# Patient Record
Sex: Female | Born: 1985 | Race: Black or African American | Hispanic: No | Marital: Single | Smoking: Current every day smoker
Health system: Southern US, Community
[De-identification: ages and names within clinical notes are randomized; demographics above are authoritative.]

## PROBLEM LIST (undated history)

## (undated) DIAGNOSIS — Z789 Other specified health status: Secondary | ICD-10-CM

## (undated) DIAGNOSIS — O139 Gestational [pregnancy-induced] hypertension without significant proteinuria, unspecified trimester: Secondary | ICD-10-CM

## (undated) DIAGNOSIS — D649 Anemia, unspecified: Secondary | ICD-10-CM

## (undated) HISTORY — DX: Gestational (pregnancy-induced) hypertension without significant proteinuria, unspecified trimester: O13.9

## (undated) HISTORY — PX: NO PAST SURGERIES: SHX2092

---

## 2016-09-30 ENCOUNTER — Emergency Department (HOSPITAL_COMMUNITY): Admission: EM | Admit: 2016-09-30 | Discharge: 2016-10-01 | Discharge status: Against Medical Advice | Payer: Self-pay

## 2016-09-30 NOTE — ED Notes (Signed)
Patient called with no answer. °

## 2016-09-30 NOTE — ED Notes (Signed)
Patient called x 3 with no answer.  All areas of the waiting room checked.

## 2017-04-15 ENCOUNTER — Emergency Department (HOSPITAL_COMMUNITY): Payer: Self-pay

## 2017-04-15 ENCOUNTER — Encounter (HOSPITAL_COMMUNITY): Payer: Self-pay | Admitting: *Deleted

## 2017-04-15 ENCOUNTER — Emergency Department (HOSPITAL_COMMUNITY)
Admission: EM | Admit: 2017-04-15 | Discharge: 2017-04-15 | Disposition: A | Discharge status: Home / Self Care | Payer: Self-pay | Attending: Emergency Medicine | Admitting: Emergency Medicine

## 2017-04-15 DIAGNOSIS — M25561 Pain in right knee: Secondary | ICD-10-CM | POA: Insufficient documentation

## 2017-04-15 DIAGNOSIS — L2082 Flexural eczema: Secondary | ICD-10-CM | POA: Insufficient documentation

## 2017-04-15 DIAGNOSIS — M545 Low back pain, unspecified: Secondary | ICD-10-CM

## 2017-04-15 MED ORDER — HYDROCORTISONE 1 % EX CREA: TOPICAL_CREAM | CUTANEOUS | 0 refills | Status: DC

## 2017-04-15 NOTE — Discharge Instructions (Signed)
Please read attached information. If you experience any new or worsening signs or symptoms please return to the emergency room for evaluation. Please follow-up with your primary care provider or specialist as discussed. Please use medication prescribed only as directed and discontinue taking if you have any concerning signs or symptoms.   °

## 2017-04-15 NOTE — ED Provider Notes (Signed)
[facility]-EMERGENCY DEPT Provider Note   CSN: [id number] Arrival date & time: 04/15/17  0946     History   Chief Complaint Chief Complaint  Patient presents with  . Back Pain  . Knee Pain    HPI Christine Mullen is a 31 y.o. female.  HPI   31 year old female presents today with numerous complaints.  Patient reports for approximately 1 week she has had left lower lumbar back pain.  She denies any radiation of symptoms, denies any abdominal pain, distal neurological deficits, or any other red flags.  Patient denies any specific injury, reports she works in a [employer] lifting heavy boxes.  She notes movements of the torso worsen her symptoms.  Patient notes ibuprofen helps with the back pain.  Patient also notes she is having right knee pain, described as diffuse achy pain with no radiation of symptoms.  Full active range of motion of the knee, no redness, swelling or warmth to touch.  She denies any infectious etiologies.  Patient denies any trauma to the knee in the past, again ibuprofen improves her symptoms.  Patient also reports dryness to the left flexor surface of her left elbow.  Denies any history of the same, no medications prior to arrival.  No other rashes noted.   History reviewed. No pertinent past medical history.  There are no active problems to display for this patient.   History reviewed. No pertinent surgical history.  OB History    No data available       Home Medications    Prior to Admission medications   Medication Sig Start Date End Date Taking? Authorizing Provider  hydrocortisone cream 1 % Apply to affected area 2 times daily 04/15/17   Shalin Linders, Tinnie GensJeffrey, PA-C    Family History No family history on file.  Social History Social History  Substance Use Topics  . Smoking status: Current Every Day Smoker  . Smokeless tobacco: Never Used  . Alcohol use Yes     Comment: daily     Allergies   Patient has no known allergies.   Review of  Systems Review of Systems  All other systems reviewed and are negative.    Physical Exam Updated Vital Signs BP (!) 141/100 (BP Location: Right Arm)   Pulse 72   Temp 98.2 F (36.8 C) (Oral)   Resp 18   LMP 03/24/2017 (Exact Date)   SpO2 99%   Physical Exam  Constitutional: She is oriented to person, place, and time. She appears well-developed and well-nourished.  HENT:  Head: Normocephalic and atraumatic.  Eyes: Pupils are equal, round, and reactive to light. Conjunctivae are normal. Right eye exhibits no discharge. Left eye exhibits no discharge. No scleral icterus.  Neck: Normal range of motion. No JVD present. No tracheal deviation present.  Pulmonary/Chest: Effort normal. No stridor.  Musculoskeletal:  No CT or L-spine tenderness palpation.  Tenderness palpation left lower lumbar soft tissue.  Straight leg negative, distal sensation strength and motor function intact.  Right knee atraumatic, no warmth to touch, no redness, full active range of motion  Neurological: She is alert and oriented to person, place, and time. Coordination normal.  Skin:  Eczema left antecubital fossa  Psychiatric: She has a normal mood and affect. Her behavior is normal. Judgment and thought content normal.  Nursing note and vitals reviewed.    ED Treatments / Results  Labs (all labs ordered are listed, but only abnormal results are displayed) Labs Reviewed - No data to display  EKG  EKG Interpretation None       Radiology Dg Knee Complete 4 Views Right  Result Date: 04/15/2017 CLINICAL DATA:  Right knee pain.  No injury. EXAM: RIGHT KNEE - COMPLETE 4+ VIEW COMPARISON:  None. FINDINGS: No evidence of fracture, dislocation, or joint effusion. No evidence of arthropathy or other focal bone abnormality. Soft tissues are unremarkable. IMPRESSION: Negative. Electronically Signed   By: Charlett Nose M.D.   On: 04/15/2017 10:39    Procedures Procedures (including critical care  time)  Medications Ordered in ED Medications - No data to display   Initial Impression / Assessment and Plan / ED Course  I have reviewed the triage vital signs and the nursing notes.  Pertinent labs & imaging results that were available during my care of the patient were reviewed by me and considered in my medical decision making (see chart for details).       Final Clinical Impressions(s) / ED Diagnoses   Final diagnoses:  Acute left-sided low back pain without sciatica  Flexural eczema  Acute pain of right knee   31 year old female presents today with numerous complaints.  Patient with likely musculoskeletal back pain.  No signs of trauma, no red flags, no need for imaging here.,  Patient also reporting right knee pain, likely overuse, no signs of infectious etiology here today.  No need for imaging at this time.  Patient will be treated with ibuprofen, rest, back strengthening exercises.  Patient also with what appears to be eczema, she will be started on hydrocortisone cream, she is given outpatient follow-up information, strict return precautions.  She verbalized understanding and agreement to today's plan had no further questions or concerns the time discharge.   New Prescriptions New Prescriptions   HYDROCORTISONE CREAM 1 %    Apply to affected area 2 times daily     Eyvonne Mechanic, PA-C 04/15/17 1219    Pricilla Loveless, MD 04/18/17 1131

## 2017-04-15 NOTE — ED Notes (Signed)
Pt seen and discharged prior to RN assessment

## 2017-04-15 NOTE — ED Triage Notes (Signed)
Pt is here with left lower back pain with movement for one week and right knee pain for a while.  No urinary or vaginal symptoms

## 2017-05-26 ENCOUNTER — Emergency Department (HOSPITAL_COMMUNITY)
Admission: EM | Admit: 2017-05-26 | Discharge: 2017-05-26 | Disposition: A | Discharge status: Home / Self Care | Payer: Self-pay | Attending: Physician Assistant | Admitting: Physician Assistant

## 2017-05-26 ENCOUNTER — Encounter (HOSPITAL_COMMUNITY): Payer: Self-pay

## 2017-05-26 DIAGNOSIS — Z79899 Other long term (current) drug therapy: Secondary | ICD-10-CM | POA: Insufficient documentation

## 2017-05-26 DIAGNOSIS — M545 Low back pain, unspecified: Secondary | ICD-10-CM

## 2017-05-26 DIAGNOSIS — F17200 Nicotine dependence, unspecified, uncomplicated: Secondary | ICD-10-CM | POA: Insufficient documentation

## 2017-05-26 MED ORDER — IBUPROFEN 800 MG PO TABS: 800.0000 mg | ORAL_TABLET | Freq: Four times a day (QID) | ORAL | 0 refills | Status: DC | PRN

## 2017-05-26 MED ORDER — CYCLOBENZAPRINE HCL 10 MG PO TABS: 10.0000 mg | ORAL_TABLET | Freq: Two times a day (BID) | ORAL | 0 refills | Status: DC | PRN

## 2017-05-26 NOTE — ED Triage Notes (Signed)
Pt states left lower back pain X1 week. 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 Pt ambulatory. Denies any numbness or tingling or loss of bowel or bladder control.

## 2017-05-26 NOTE — ED Provider Notes (Signed)
[facility]   CSN: [id number] Arrival date & time: 05/26/17  1019     History   Chief Complaint Chief Complaint  Patient presents with  . Back Pain    HPI Christine Bonieriffany Mullen is a 31 y.o. female.  HPI  Christine Mullen is a 31yo female with no significant past medical history who presents to the emergency department with 1 week of lower left back pain. She states that she had similar symptoms several weeks ago, with improvement from ibuprofen and rest. Beginning last week she again experienced left lower back pain which is 8/10 in severity, "sharp" in nature and constant. It is worsened with leaning forward. She states that ibuprofen helps her symptoms. Denies recent injury but says that she works at a [employer] and lifts heavy boxes at work. She denies fever, urinary retention/incontinence, bowel incontinence, perineal anesthesia, numbness, weakness, abdominal pain, vaginal discharge, pelvic pain. Denies IV drug use. No history of cancer in the past. No previous back surgeries.  History reviewed. No pertinent past medical history.  There are no active problems to display for this patient.   History reviewed. No pertinent surgical history.  OB History    No data available       Home Medications    Prior to Admission medications   Medication Sig Start Date End Date Taking? Authorizing Provider  cyclobenzaprine (FLEXERIL) 10 MG tablet Take 1 tablet (10 mg total) by mouth 2 (two) times daily as needed for muscle spasms. 05/26/17   Kellie ShropshireShrosbree, Yesica Kemler J, PA-C  hydrocortisone cream 1 % Apply to affected area 2 times daily 04/15/17   Hedges, Tinnie GensJeffrey, PA-C  ibuprofen (ADVIL,MOTRIN) 800 MG tablet Take 1 tablet (800 mg total) by mouth every 6 (six) hours as needed. 05/26/17   Kellie ShropshireShrosbree, Nicco Reaume J, PA-C    Family History History reviewed. No pertinent family history.  Social History Social History  Substance Use Topics  . Smoking  status: Current Every Day Smoker  . Smokeless tobacco: Never Used  . Alcohol use Yes     Comment: daily     Allergies   Patient has no known allergies.   Review of Systems Review of Systems  Constitutional: Negative for chills, fatigue, fever and unexpected weight change.  Gastrointestinal: Negative for abdominal pain, diarrhea, nausea and vomiting.  Genitourinary: Negative for difficulty urinating, dysuria, flank pain, hematuria, pelvic pain and vaginal discharge.  Musculoskeletal: Positive for back pain. Negative for arthralgias, gait problem, myalgias, neck pain and neck stiffness.  Skin: Negative for rash and wound.  Neurological: Negative for weakness, numbness and headaches.     Physical Exam Updated Vital Signs BP (!) 151/94 (BP Location: Left Arm)   Pulse 62   Temp 97.9 F (36.6 C) (Oral)   Resp 16   LMP 05/20/2017 (Within Days)   SpO2 100%   Physical Exam  Constitutional: She is oriented to person, place, and time. She appears well-developed and well-nourished. No distress.  HENT:  Head: Normocephalic and atraumatic.  Eyes: Right eye exhibits no discharge. Left eye exhibits no discharge.  Neck: Normal range of motion. Neck supple.  No tenderness over the cervical spinous processes.  Pulmonary/Chest: Effort normal. No respiratory distress.  Abdominal: Soft. Bowel sounds are normal. There is no tenderness. There is no guarding.  No pulsatile mass. No CVA tenderness.  Musculoskeletal:  Tenderness to palpation over the left lumbar paraspinal muscles. No tenderness to palpation over the spinous processes of the thoracic or lumbar spine.  5/5 strength in bilateral knees, ankle joints. DP and PT pulses 2+ bilaterally.  Neurological: She is alert and oriented to person, place, and time. Coordination normal.  Patellar reflex 2+ bilaterally. Distal sensation to soft touch intact in bilateral lower extremities. Gait normal.   Skin: Skin is warm and dry. Capillary refill  takes less than 2 seconds. She is not diaphoretic.  Psychiatric: She has a normal mood and affect. Her behavior is normal.  Nursing note and vitals reviewed.    ED Treatments / Results  Labs (all labs ordered are listed, but only abnormal results are displayed) Labs Reviewed - No data to display  EKG  EKG Interpretation None       Radiology No results found.  Procedures Procedures (including critical care time)  Medications Ordered in ED Medications - No data to display   Initial Impression / Assessment and Plan / ED Course  I have reviewed the triage vital signs and the nursing notes.  Pertinent labs & imaging results that were available during my care of the patient were reviewed by me and considered in my medical decision making (see chart for details).     Patient with back pain. No neurological deficits and normal neuro exam.  Patient can walk independently without pain. No loss of bowel or bladder control. No concern for cauda equina.  No fever, night sweats, weight loss, h/o cancer, IVDU. RICE protocol and pain medicine indicated and discussed with patient. Have discussed return precautions including loss of continence, fever with back pain, numbness or weakness in the legs with pain. Patient's blood pressure elevated while in the ED, have counseled her to have this rechecked at primary care office. Patient agrees and voices understanding with the above plan.  Final Clinical Impressions(s) / ED Diagnoses   Final diagnoses:  Acute left-sided low back pain without sciatica    New Prescriptions New Prescriptions   CYCLOBENZAPRINE (FLEXERIL) 10 MG TABLET    Take 1 tablet (10 mg total) by mouth 2 (two) times daily as needed for muscle spasms.   IBUPROFEN (ADVIL,MOTRIN) 800 MG TABLET    Take 1 tablet (800 mg total) by mouth every 6 (six) hours as needed.     Kellie Shropshire, PA-C 05/26/17 1245    Abelino Derrick, MD 05/26/17 1600

## 2017-05-26 NOTE — Discharge Instructions (Signed)
Please take 800 mg of ibuprofen every 6 hours as needed for pain. I have also written a prescription for a muscle relaxer called Flexeril. This medicine can make you drowsy, please do not drive or drink alcohol while taking this medication. Applying heat to the lower back will also help with your symptoms.  I have written him a note for work.  Your blood pressure was elevated in the ER today. Please have this rechecked your primary care doctor's office.  Return to the ER if you experience fever with back pain, have trouble urinating because of back pain, have new numbness or weakness in her legs with back pain. Also return for any new or concerning symptoms.

## 2017-05-26 NOTE — ED Notes (Signed)
C/o lower back pain onset several weeks ago, states she was seen in the ED for same and given advil, states she was told she strained her back , however now advil isn't working.

## 2018-03-13 ENCOUNTER — Ambulatory Visit (INDEPENDENT_AMBULATORY_CARE_PROVIDER_SITE_OTHER): Payer: PRIVATE HEALTH INSURANCE | Admitting: *Deleted

## 2018-03-13 DIAGNOSIS — Z3201 Encounter for pregnancy test, result positive: Secondary | ICD-10-CM

## 2018-03-13 DIAGNOSIS — Z3200 Encounter for pregnancy test, result unknown: Secondary | ICD-10-CM

## 2018-03-13 LAB — POCT URINE PREGNANCY: PREG TEST UR: POSITIVE — AB

## 2018-03-13 NOTE — Progress Notes (Signed)
[name] presents today for UPT. She has no unusual complaints. LMP:01/28/18    OBJECTIVE: Appears well, in no apparent distress.  OB History   None    Home UPT Result:Positve In-Office UPT result:Positive  ASSESSMENT: Positive pregnancy test  PLAN Prenatal care to be completed at: [facility] PNV samples given at today's visit.

## 2018-04-16 ENCOUNTER — Ambulatory Visit (INDEPENDENT_AMBULATORY_CARE_PROVIDER_SITE_OTHER): Payer: PRIVATE HEALTH INSURANCE | Admitting: Obstetrics and Gynecology

## 2018-04-16 ENCOUNTER — Encounter: Payer: Self-pay | Admitting: Obstetrics and Gynecology

## 2018-04-16 DIAGNOSIS — Z113 Encounter for screening for infections with a predominantly sexual mode of transmission: Secondary | ICD-10-CM

## 2018-04-16 DIAGNOSIS — Z3400 Encounter for supervision of normal first pregnancy, unspecified trimester: Secondary | ICD-10-CM

## 2018-04-16 DIAGNOSIS — Z1151 Encounter for screening for human papillomavirus (HPV): Secondary | ICD-10-CM

## 2018-04-16 DIAGNOSIS — Z124 Encounter for screening for malignant neoplasm of cervix: Secondary | ICD-10-CM

## 2018-04-16 NOTE — Progress Notes (Signed)
  Subjective:    Christine Mullen is a G1P0 Unknown being seen today for her first obstetrical visit.  Her obstetrical history is significant for first pregnancy. Patient does not intend to breast feed. Pregnancy history fully reviewed.  Patient reports no complaints.  Vitals:   04/16/18 1352 04/16/18 1353  BP: 133/86   Pulse: (!) 103   Weight: 231 lb 8 oz (105 kg)   Height:  5\' 6"  (1.676 m)    HISTORY: OB History  Gravida Para Term Preterm AB Living  1            SAB TAB Ectopic Multiple Live Births               # Outcome Date GA Lbr Len/2nd Weight Sex Delivery Anes PTL Lv  1 Current            History reviewed. No pertinent past medical history. History reviewed. No pertinent surgical history. History reviewed. No pertinent family history.   Exam    Uterus:   10-weeks  Pelvic Exam:    Perineum: No Hemorrhoids, Normal Perineum   Vulva: normal   Vagina:  normal mucosa, normal discharge   pH:    Cervix: nulliparous appearance and cervix is closed and long   Adnexa: normal adnexa   Bony Pelvis: gynecoid  System: Breast:  normal appearance, no masses or tenderness   Skin: normal coloration and turgor, no rashes    Neurologic: oriented, no focal deficits   Extremities: normal strength, tone, and muscle mass   HEENT extra ocular movement intact   Mouth/Teeth mucous membranes moist, pharynx normal without lesions and dental hygiene good   Neck supple   Cardiovascular: regular rate and rhythm   Respiratory:  appears well, vitals normal, no respiratory distress, acyanotic, normal RR, chest clear, no wheezing, crepitations, rhonchi, normal symmetric air entry   Abdomen: soft, non-tender; bowel sounds normal; no masses,  no organomegaly   Urinary:       Assessment:    Pregnancy: G1P0 Patient Active Problem List   Diagnosis Date Noted  . Supervision of normal first pregnancy, antepartum 04/16/2018        Plan:     Initial labs drawn. Prenatal  vitamins. Problem list reviewed and updated. Genetic Screening discussed : panorama.  Ultrasound discussed; fetal survey: requested.  Follow up in 4 weeks. 50% of 30 min visit spent on counseling and coordination of care.     Greyden Besecker 04/16/2018

## 2018-04-16 NOTE — Patient Instructions (Signed)
 First Trimester of Pregnancy The first trimester of pregnancy is from week 1 until the end of week 13 (months 1 through 3). A week after a sperm fertilizes an egg, the egg will implant on the wall of the uterus. This embryo will begin to develop into a baby. Genes from you and your partner will form the baby. The female genes will determine whether the baby will be a boy or a girl. At 6-8 weeks, the eyes and face will be formed, and the heartbeat can be seen on ultrasound. At the end of 12 weeks, all the baby's organs will be formed. Now that you are pregnant, you will want to do everything you can to have a healthy baby. Two of the most important things are to get good prenatal care and to follow your health care provider's instructions. Prenatal care is all the medical care you receive before the baby's birth. This care will help prevent, find, and treat any problems during the pregnancy and childbirth. Body changes during your first trimester Your body goes through many changes during pregnancy. The changes vary from woman to woman.  You may gain or lose a couple of pounds at first.  You may feel sick to your stomach (nauseous) and you may throw up (vomit). If the vomiting is uncontrollable, call your health care provider.  You may tire easily.  You may develop headaches that can be relieved by medicines. All medicines should be approved by your health care provider.  You may urinate more often. Painful urination may mean you have a bladder infection.  You may develop heartburn as a result of your pregnancy.  You may develop constipation because certain hormones are causing the muscles that push stool through your intestines to slow down.  You may develop hemorrhoids or swollen veins (varicose veins).  Your breasts may begin to grow larger and become tender. Your nipples may stick out more, and the tissue that surrounds them (areola) may become darker.  Your gums may bleed and may be  sensitive to brushing and flossing.  Dark spots or blotches (chloasma, mask of pregnancy) may develop on your face. This will likely fade after the baby is born.  Your menstrual periods will stop.  You may have a loss of appetite.  You may develop cravings for certain kinds of food.  You may have changes in your emotions from day to day, such as being excited to be pregnant or being concerned that something may go wrong with the pregnancy and baby.  You may have more vivid and strange dreams.  You may have changes in your hair. These can include thickening of your hair, rapid growth, and changes in texture. Some women also have hair loss during or after pregnancy, or hair that feels dry or thin. Your hair will most likely return to normal after your baby is born.  What to expect at prenatal visits During a routine prenatal visit:  You will be weighed to make sure you and the baby are growing normally.  Your blood pressure will be taken.  Your abdomen will be measured to track your baby's growth.  The fetal heartbeat will be listened to between weeks 10 and 14 of your pregnancy.  Test results from any previous visits will be discussed.  Your health care provider may ask you:  How you are feeling.  If you are feeling the baby move.  If you have had any abnormal symptoms, such as leaking fluid, bleeding, severe   headaches, or abdominal cramping.  If you are using any tobacco products, including cigarettes, chewing tobacco, and electronic cigarettes.  If you have any questions.  Other tests that may be performed during your first trimester include:  Blood tests to find your blood type and to check for the presence of any previous infections. The tests will also be used to check for low iron levels (anemia) and protein on red blood cells (Rh antibodies). Depending on your risk factors, or if you previously had diabetes during pregnancy, you may have tests to check for high blood  sugar that affects pregnant women (gestational diabetes).  Urine tests to check for infections, diabetes, or protein in the urine.  An ultrasound to confirm the proper growth and development of the baby.  Fetal screens for spinal cord problems (spina bifida) and Down syndrome.  HIV (human immunodeficiency virus) testing. Routine prenatal testing includes screening for HIV, unless you choose not to have this test.  You may need other tests to make sure you and the baby are doing well.  Follow these instructions at home: Medicines  Follow your health care provider's instructions regarding medicine use. Specific medicines may be either safe or unsafe to take during pregnancy.  Take a prenatal vitamin that contains at least 600 micrograms (mcg) of folic acid.  If you develop constipation, try taking a stool softener if your health care provider approves. Eating and drinking  Eat a balanced diet that includes fresh fruits and vegetables, whole grains, good sources of protein such as meat, eggs, or tofu, and low-fat dairy. Your health care provider will help you determine the amount of weight gain that is right for you.  Avoid raw meat and uncooked cheese. These carry germs that can cause birth defects in the baby.  Eating four or five small meals rather than three large meals a day may help relieve nausea and vomiting. If you start to feel nauseous, eating a few soda crackers can be helpful. Drinking liquids between meals, instead of during meals, also seems to help ease nausea and vomiting.  Limit foods that are high in fat and processed sugars, such as fried and sweet foods.  To prevent constipation: ? Eat foods that are high in fiber, such as fresh fruits and vegetables, whole grains, and beans. ? Drink enough fluid to keep your urine clear or pale yellow. Activity  Exercise only as directed by your health care provider. Most women can continue their usual exercise routine during  pregnancy. Try to exercise for 30 minutes at least 5 days a week. Exercising will help you: ? Control your weight. ? Stay in shape. ? Be prepared for labor and delivery.  Experiencing pain or cramping in the lower abdomen or lower back is a good sign that you should stop exercising. Check with your health care provider before continuing with normal exercises.  Try to avoid standing for long periods of time. Move your legs often if you must stand in one place for a long time.  Avoid heavy lifting.  Wear low-heeled shoes and practice good posture.  You may continue to have sex unless your health care provider tells you not to. Relieving pain and discomfort  Wear a good support bra to relieve breast tenderness.  Take warm sitz baths to soothe any pain or discomfort caused by hemorrhoids. Use hemorrhoid cream if your health care provider approves.  Rest with your legs elevated if you have leg cramps or low back pain.  If you   develop varicose veins in your legs, wear support hose. Elevate your feet for 15 minutes, 3-4 times a day. Limit salt in your diet. Prenatal care  Schedule your prenatal visits by the twelfth week of pregnancy. They are usually scheduled monthly at first, then more often in the last 2 months before delivery.  Write down your questions. Take them to your prenatal visits.  Keep all your prenatal visits as told by your health care provider. This is important. Safety  Wear your seat belt at all times when driving.  Make a list of emergency phone numbers, including numbers for family, friends, the hospital, and police and fire departments. General instructions  Ask your health care provider for a referral to a local prenatal education class. Begin classes no later than the beginning of month 6 of your pregnancy.  Ask for help if you have counseling or nutritional needs during pregnancy. Your health care provider can offer advice or refer you to specialists for help  with various needs.  Do not use hot tubs, steam rooms, or saunas.  Do not douche or use tampons or scented sanitary pads.  Do not cross your legs for long periods of time.  Avoid cat litter boxes and soil used by cats. These carry germs that can cause birth defects in the baby and possibly loss of the fetus by miscarriage or stillbirth.  Avoid all smoking, herbs, alcohol, and medicines not prescribed by your health care provider. Chemicals in these products affect the formation and growth of the baby.  Do not use any products that contain nicotine or tobacco, such as cigarettes and e-cigarettes. If you need help quitting, ask your health care provider. You may receive counseling support and other resources to help you quit.  Schedule a dentist appointment. At home, brush your teeth with a soft toothbrush and be gentle when you floss. Contact a health care provider if:  You have dizziness.  You have mild pelvic cramps, pelvic pressure, or nagging pain in the abdominal area.  You have persistent nausea, vomiting, or diarrhea.  You have a bad smelling vaginal discharge.  You have pain when you urinate.  You notice increased swelling in your face, hands, legs, or ankles.  You are exposed to fifth disease or chickenpox.  You are exposed to German measles (rubella) and have never had it. Get help right away if:  You have a fever.  You are leaking fluid from your vagina.  You have spotting or bleeding from your vagina.  You have severe abdominal cramping or pain.  You have rapid weight gain or loss.  You vomit blood or material that looks like coffee grounds.  You develop a severe headache.  You have shortness of breath.  You have any kind of trauma, such as from a fall or a car accident. Summary  The first trimester of pregnancy is from week 1 until the end of week 13 (months 1 through 3).  Your body goes through many changes during pregnancy. The changes vary from  woman to woman.  You will have routine prenatal visits. During those visits, your health care provider will examine you, discuss any test results you may have, and talk with you about how you are feeling. This information is not intended to replace advice given to you by your health care provider. Make sure you discuss any questions you have with your health care provider. Document Released: 07/16/2001 Document Revised: 07/03/2016 Document Reviewed: 07/03/2016 [employer] Interactive Patient Education  2018   [employer]   Second Trimester of Pregnancy The second trimester is from week 14 through week 27 (months 4 through 6). The second trimester is often a time when you feel your best. Your body has adjusted to being pregnant, and you begin to feel better physically. Usually, morning sickness has lessened or quit completely, you may have more energy, and you may have an increase in appetite. The second trimester is also a time when the fetus is growing rapidly. At the end of the sixth month, the fetus is about 9 inches long and weighs about 1 pounds. You will likely begin to feel the baby move (quickening) between 16 and 20 weeks of pregnancy. Body changes during your second trimester Your body continues to go through many changes during your second trimester. The changes vary from woman to woman.  Your weight will continue to increase. You will notice your lower abdomen bulging out.  You may begin to get stretch marks on your hips, abdomen, and breasts.  You may develop headaches that can be relieved by medicines. The medicines should be approved by your health care provider.  You may urinate more often because the fetus is pressing on your bladder.  You may develop or continue to have heartburn as a result of your pregnancy.  You may develop constipation because certain hormones are causing the muscles that push waste through your intestines to slow down.  You may develop hemorrhoids or  swollen, bulging veins (varicose veins).  You may have back pain. This is caused by: ? Weight gain. ? Pregnancy hormones that are relaxing the joints in your pelvis. ? A shift in weight and the muscles that support your balance.  Your breasts will continue to grow and they will continue to become tender.  Your gums may bleed and may be sensitive to brushing and flossing.  Dark spots or blotches (chloasma, mask of pregnancy) may develop on your face. This will likely fade after the baby is born.  A dark line from your belly button to the pubic area (linea nigra) may appear. This will likely fade after the baby is born.  You may have changes in your hair. These can include thickening of your hair, rapid growth, and changes in texture. Some women also have hair loss during or after pregnancy, or hair that feels dry or thin. Your hair will most likely return to normal after your baby is born.  What to expect at prenatal visits During a routine prenatal visit:  You will be weighed to make sure you and the fetus are growing normally.  Your blood pressure will be taken.  Your abdomen will be measured to track your baby's growth.  The fetal heartbeat will be listened to.  Any test results from the previous visit will be discussed.  Your health care provider may ask you:  How you are feeling.  If you are feeling the baby move.  If you have had any abnormal symptoms, such as leaking fluid, bleeding, severe headaches, or abdominal cramping.  If you are using any tobacco products, including cigarettes, chewing tobacco, and electronic cigarettes.  If you have any questions.  Other tests that may be performed during your second trimester include:  Blood tests that check for: ? Low iron levels (anemia). ? High blood sugar that affects pregnant women (gestational diabetes) between 24 and 28 weeks. ? Rh antibodies. This is to check for a protein on red blood cells (Rh factor).  Urine  tests to   check for infections, diabetes, or protein in the urine.  An ultrasound to confirm the proper growth and development of the baby.  An amniocentesis to check for possible genetic problems.  Fetal screens for spina bifida and Down syndrome.  HIV (human immunodeficiency virus) testing. Routine prenatal testing includes screening for HIV, unless you choose not to have this test.  Follow these instructions at home: Medicines  Follow your health care provider's instructions regarding medicine use. Specific medicines may be either safe or unsafe to take during pregnancy.  Take a prenatal vitamin that contains at least 600 micrograms (mcg) of folic acid.  If you develop constipation, try taking a stool softener if your health care provider approves. Eating and drinking  Eat a balanced diet that includes fresh fruits and vegetables, whole grains, good sources of protein such as meat, eggs, or tofu, and low-fat dairy. Your health care provider will help you determine the amount of weight gain that is right for you.  Avoid raw meat and uncooked cheese. These carry germs that can cause birth defects in the baby.  If you have low calcium intake from food, talk to your health care provider about whether you should take a daily calcium supplement.  Limit foods that are high in fat and processed sugars, such as fried and sweet foods.  To prevent constipation: ? Drink enough fluid to keep your urine clear or pale yellow. ? Eat foods that are high in fiber, such as fresh fruits and vegetables, whole grains, and beans. Activity  Exercise only as directed by your health care provider. Most women can continue their usual exercise routine during pregnancy. Try to exercise for 30 minutes at least 5 days a week. Stop exercising if you experience uterine contractions.  Avoid heavy lifting, wear low heel shoes, and practice good posture.  A sexual relationship may be continued unless your health  care provider directs you otherwise. Relieving pain and discomfort  Wear a good support bra to prevent discomfort from breast tenderness.  Take warm sitz baths to soothe any pain or discomfort caused by hemorrhoids. Use hemorrhoid cream if your health care provider approves.  Rest with your legs elevated if you have leg cramps or low back pain.  If you develop varicose veins, wear support hose. Elevate your feet for 15 minutes, 3-4 times a day. Limit salt in your diet. Prenatal Care  Write down your questions. Take them to your prenatal visits.  Keep all your prenatal visits as told by your health care provider. This is important. Safety  Wear your seat belt at all times when driving.  Make a list of emergency phone numbers, including numbers for family, friends, the hospital, and police and fire departments. General instructions  Ask your health care provider for a referral to a local prenatal education class. Begin classes no later than the beginning of month 6 of your pregnancy.  Ask for help if you have counseling or nutritional needs during pregnancy. Your health care provider can offer advice or refer you to specialists for help with various needs.  Do not use hot tubs, steam rooms, or saunas.  Do not douche or use tampons or scented sanitary pads.  Do not cross your legs for long periods of time.  Avoid cat litter boxes and soil used by cats. These carry germs that can cause birth defects in the baby and possibly loss of the fetus by miscarriage or stillbirth.  Avoid all smoking, herbs, alcohol, and unprescribed drugs. Chemicals   in these products can affect the formation and growth of the baby.  Do not use any products that contain nicotine or tobacco, such as cigarettes and e-cigarettes. If you need help quitting, ask your health care provider.  Visit your dentist if you have not gone yet during your pregnancy. Use a soft toothbrush to brush your teeth and be gentle when  you floss. Contact a health care provider if:  You have dizziness.  You have mild pelvic cramps, pelvic pressure, or nagging pain in the abdominal area.  You have persistent nausea, vomiting, or diarrhea.  You have a bad smelling vaginal discharge.  You have pain when you urinate. Get help right away if:  You have a fever.  You are leaking fluid from your vagina.  You have spotting or bleeding from your vagina.  You have severe abdominal cramping or pain.  You have rapid weight gain or weight loss.  You have shortness of breath with chest pain.  You notice sudden or extreme swelling of your face, hands, ankles, feet, or legs.  You have not felt your baby move in over an hour.  You have severe headaches that do not go away when you take medicine.  You have vision changes. Summary  The second trimester is from week 14 through week 27 (months 4 through 6). It is also a time when the fetus is growing rapidly.  Your body goes through many changes during pregnancy. The changes vary from woman to woman.  Avoid all smoking, herbs, alcohol, and unprescribed drugs. These chemicals affect the formation and growth your baby.  Do not use any tobacco products, such as cigarettes, chewing tobacco, and e-cigarettes. If you need help quitting, ask your health care provider.  Contact your health care provider if you have any questions. Keep all prenatal visits as told by your health care provider. This is important. This information is not intended to replace advice given to you by your health care provider. Make sure you discuss any questions you have with your health care provider. Document Released: 07/16/2001 Document Revised: 08/27/2016 Document Reviewed: 08/27/2016 [employer] Interactive Patient Education  2018 [employer]   Contraception Choices Contraception, also called birth control, refers to methods or devices that prevent pregnancy. Hormonal methods Contraceptive  implant A contraceptive implant is a thin, plastic tube that contains a hormone. It is inserted into the upper part of the arm. It can remain in place for up to 3 years. Progestin-only injections Progestin-only injections are injections of progestin, a synthetic form of the hormone progesterone. They are given every 3 months by a health care provider. Birth control pills Birth control pills are pills that contain hormones that prevent pregnancy. They must be taken once a day, preferably at the same time each day. Birth control patch The birth control patch contains hormones that prevent pregnancy. It is placed on the skin and must be changed once a week for three weeks and removed on the fourth week. A prescription is needed to use this method of contraception. Vaginal ring A vaginal ring contains hormones that prevent pregnancy. It is placed in the vagina for three weeks and removed on the fourth week. After that, the process is repeated with a new ring. A prescription is needed to use this method of contraception. Emergency contraceptive Emergency contraceptives prevent pregnancy after unprotected sex. They come in pill form and can be taken up to 5 days after sex. They work best the sooner they are taken after having   sex. Most emergency contraceptives are available without a prescription. This method should not be used as your only form of birth control. Barrier methods Female condom A female condom is a thin sheath that is worn over the penis during sex. Condoms keep sperm from going inside a woman's body. They can be used with a spermicide to increase their effectiveness. They should be disposed after a single use. Female condom A female condom is a soft, loose-fitting sheath that is put into the vagina before sex. The condom keeps sperm from going inside a woman's body. They should be disposed after a single use. Diaphragm A diaphragm is a soft, dome-shaped barrier. It is inserted into the vagina  before sex, along with a spermicide. The diaphragm blocks sperm from entering the uterus, and the spermicide kills sperm. A diaphragm should be left in the vagina for 6-8 hours after sex and removed within 24 hours. A diaphragm is prescribed and fitted by a health care provider. A diaphragm should be replaced every 1-2 years, after giving birth, after gaining more than 15 lb (6.8 kg), and after pelvic surgery. Cervical cap A cervical cap is a round, soft latex or plastic cup that fits over the cervix. It is inserted into the vagina before sex, along with spermicide. It blocks sperm from entering the uterus. The cap should be left in place for 6-8 hours after sex and removed within 48 hours. A cervical cap must be prescribed and fitted by a health care provider. It should be replaced every 2 years. Sponge A sponge is a soft, circular piece of polyurethane foam with spermicide on it. The sponge helps block sperm from entering the uterus, and the spermicide kills sperm. To use it, you make it wet and then insert it into the vagina. It should be inserted before sex, left in for at least 6 hours after sex, and removed and thrown away within 30 hours. Spermicides Spermicides are chemicals that kill or block sperm from entering the cervix and uterus. They can come as a cream, jelly, suppository, foam, or tablet. A spermicide should be inserted into the vagina with an applicator at least 10-15 minutes before sex to allow time for it to work. The process must be repeated every time you have sex. Spermicides do not require a prescription. Intrauterine contraception Intrauterine device (IUD) An IUD is a T-shaped device that is put in a woman's uterus. There are two types:  Hormone IUD.This type contains progestin, a synthetic form of the hormone progesterone. This type can stay in place for 3-5 years.  Copper IUD.This type is wrapped in copper wire. It can stay in place for 10 years.  Permanent methods of  contraception Female tubal ligation In this method, a woman's fallopian tubes are sealed, tied, or blocked during surgery to prevent eggs from traveling to the uterus. Hysteroscopic sterilization In this method, a small, flexible insert is placed into each fallopian tube. The inserts cause scar tissue to form in the fallopian tubes and block them, so sperm cannot reach an egg. The procedure takes about 3 months to be effective. Another form of birth control must be used during those 3 months. Female sterilization This is a procedure to tie off the tubes that carry sperm (vasectomy). After the procedure, the man can still ejaculate fluid (semen). Natural planning methods Natural family planning In this method, a couple does not have sex on days when the woman could become pregnant. Calendar method This means keeping track of the   length of each menstrual cycle, identifying the days when pregnancy can happen, and not having sex on those days. Ovulation method In this method, a couple avoids sex during ovulation. Symptothermal method This method involves not having sex during ovulation. The woman typically checks for ovulation by watching changes in her temperature and in the consistency of cervical mucus. Post-ovulation method In this method, a couple waits to have sex until after ovulation. Summary  Contraception, also called birth control, means methods or devices that prevent pregnancy.  Hormonal methods of contraception include implants, injections, pills, patches, vaginal rings, and emergency contraceptives.  Barrier methods of contraception can include female condoms, female condoms, diaphragms, cervical caps, sponges, and spermicides.  There are two types of IUDs (intrauterine devices). An IUD can be put in a woman's uterus to prevent pregnancy for 3-5 years.  Permanent sterilization can be done through a procedure for males, females, or both.  Natural family planning methods involve  not having sex on days when the woman could become pregnant. This information is not intended to replace advice given to you by your health care provider. Make sure you discuss any questions you have with your health care provider. Document Released: 07/22/2005 Document Revised: 08/24/2016 Document Reviewed: 08/24/2016 Elsevier Interactive Patient Education  2018 Elsevier Inc.   Breastfeeding Choosing to breastfeed is one of the best decisions you can make for yourself and your baby. A change in hormones during pregnancy causes your breasts to make breast milk in your milk-producing glands. Hormones prevent breast milk from being released before your baby is born. They also prompt milk flow after birth. Once breastfeeding has begun, thoughts of your baby, as well as his or her sucking or crying, can stimulate the release of milk from your milk-producing glands. Benefits of breastfeeding Research shows that breastfeeding offers many health benefits for infants and mothers. It also offers a cost-free and convenient way to feed your baby. For your baby  Your first milk (colostrum) helps your baby's digestive system to function better.  Special cells in your milk (antibodies) help your baby to fight off infections.  Breastfed babies are less likely to develop asthma, allergies, obesity, or type 2 diabetes. They are also at lower risk for sudden infant death syndrome (SIDS).  Nutrients in breast milk are better able to meet your baby's needs compared to infant formula.  Breast milk improves your baby's brain development. For you  Breastfeeding helps to create a very special bond between you and your baby.  Breastfeeding is convenient. Breast milk costs nothing and is always available at the correct temperature.  Breastfeeding helps to burn calories. It helps you to lose the weight that you gained during pregnancy.  Breastfeeding makes your uterus return faster to its size before pregnancy.  It also slows bleeding (lochia) after you give birth.  Breastfeeding helps to lower your risk of developing type 2 diabetes, osteoporosis, rheumatoid arthritis, cardiovascular disease, and breast, ovarian, uterine, and endometrial cancer later in life. Breastfeeding basics Starting breastfeeding  Find a comfortable place to sit or lie down, with your neck and back well-supported.  Place a pillow or a rolled-up blanket under your baby to bring him or her to the level of your breast (if you are seated). Nursing pillows are specially designed to help support your arms and your baby while you breastfeed.  Make sure that your baby's tummy (abdomen) is facing your abdomen.  Gently massage your breast. With your fingertips, massage from the outer edges of   your breast inward toward the nipple. This encourages milk flow. If your milk flows slowly, you may need to continue this action during the feeding.  Support your breast with 4 fingers underneath and your thumb above your nipple (make the letter "C" with your hand). Make sure your fingers are well away from your nipple and your baby's mouth.  Stroke your baby's lips gently with your finger or nipple.  When your baby's mouth is open wide enough, quickly bring your baby to your breast, placing your entire nipple and as much of the areola as possible into your baby's mouth. The areola is the colored area around your nipple. ? More areola should be visible above your baby's upper lip than below the lower lip. ? Your baby's lips should be opened and extended outward (flanged) to ensure an adequate, comfortable latch. ? Your baby's tongue should be between his or her lower gum and your breast.  Make sure that your baby's mouth is correctly positioned around your nipple (latched). Your baby's lips should create a seal on your breast and be turned out (everted).  It is common for your baby to suck about 2-3 minutes in order to start the flow of breast  milk. Latching Teaching your baby how to latch onto your breast properly is very important. An improper latch can cause nipple pain, decreased milk supply, and poor weight gain in your baby. Also, if your baby is not latched onto your nipple properly, he or she may swallow some air during feeding. This can make your baby fussy. Burping your baby when you switch breasts during the feeding can help to get rid of the air. However, teaching your baby to latch on properly is still the best way to prevent fussiness from swallowing air while breastfeeding. Signs that your baby has successfully latched onto your nipple  Silent tugging or silent sucking, without causing you pain. Infant's lips should be extended outward (flanged).  Swallowing heard between every 3-4 sucks once your milk has started to flow (after your let-down milk reflex occurs).  Muscle movement above and in front of his or her ears while sucking.  Signs that your baby has not successfully latched onto your nipple  Sucking sounds or smacking sounds from your baby while breastfeeding.  Nipple pain.  If you think your baby has not latched on correctly, slip your finger into the corner of your baby's mouth to break the suction and place it between your baby's gums. Attempt to start breastfeeding again. Signs of successful breastfeeding Signs from your baby  Your baby will gradually decrease the number of sucks or will completely stop sucking.  Your baby will fall asleep.  Your baby's body will relax.  Your baby will retain a small amount of milk in his or her mouth.  Your baby will let go of your breast by himself or herself.  Signs from you  Breasts that have increased in firmness, weight, and size 1-3 hours after feeding.  Breasts that are softer immediately after breastfeeding.  Increased milk volume, as well as a change in milk consistency and color by the fifth day of breastfeeding.  Nipples that are not sore,  cracked, or bleeding.  Signs that your baby is getting enough milk  Wetting at least 1-2 diapers during the first 24 hours after birth.  Wetting at least 5-6 diapers every 24 hours for the first week after birth. The urine should be clear or pale yellow by the age of 5   days.  Wetting 6-8 diapers every 24 hours as your baby continues to grow and develop.  At least 3 stools in a 24-hour period by the age of 5 days. The stool should be soft and yellow.  At least 3 stools in a 24-hour period by the age of 7 days. The stool should be seedy and yellow.  No loss of weight greater than 10% of birth weight during the first 3 days of life.  Average weight gain of 4-7 oz (113-198 g) per week after the age of 4 days.  Consistent daily weight gain by the age of 5 days, without weight loss after the age of 2 weeks. After a feeding, your baby may spit up a small amount of milk. This is normal. Breastfeeding frequency and duration Frequent feeding will help you make more milk and can prevent sore nipples and extremely full breasts (breast engorgement). Breastfeed when you feel the need to reduce the fullness of your breasts or when your baby shows signs of hunger. This is called "breastfeeding on demand." Signs that your baby is hungry include:  Increased alertness, activity, or restlessness.  Movement of the head from side to side.  Opening of the mouth when the corner of the mouth or cheek is stroked (rooting).  Increased sucking sounds, smacking lips, cooing, sighing, or squeaking.  Hand-to-mouth movements and sucking on fingers or hands.  Fussing or crying.  Avoid introducing a pacifier to your baby in the first 4-6 weeks after your baby is born. After this time, you may choose to use a pacifier. Research has shown that pacifier use during the first year of a baby's life decreases the risk of sudden infant death syndrome (SIDS). Allow your baby to feed on each breast as long as he or she  wants. When your baby unlatches or falls asleep while feeding from the first breast, offer the second breast. Because newborns are often sleepy in the first few weeks of life, you may need to awaken your baby to get him or her to feed. Breastfeeding times will vary from baby to baby. However, the following rules can serve as a guide to help you make sure that your baby is properly fed:  Newborns (babies 4 weeks of age or younger) may breastfeed every 1-3 hours.  Newborns should not go without breastfeeding for longer than 3 hours during the day or 5 hours during the night.  You should breastfeed your baby a minimum of 8 times in a 24-hour period.  Breast milk pumping Pumping and storing breast milk allows you to make sure that your baby is exclusively fed your breast milk, even at times when you are unable to breastfeed. This is especially important if you go back to work while you are still breastfeeding, or if you are not able to be present during feedings. Your lactation consultant can help you find a method of pumping that works best for you and give you guidelines about how long it is safe to store breast milk. Caring for your breasts while you breastfeed Nipples can become dry, cracked, and sore while breastfeeding. The following recommendations can help keep your breasts moisturized and healthy:  Avoid using soap on your nipples.  Wear a supportive bra designed especially for nursing. Avoid wearing underwire-style bras or extremely tight bras (sports bras).  Air-dry your nipples for 3-4 minutes after each feeding.  Use only cotton bra pads to absorb leaked breast milk. Leaking of breast milk between feedings is normal.    Use lanolin on your nipples after breastfeeding. Lanolin helps to maintain your skin's normal moisture barrier. Pure lanolin is not harmful (not toxic) to your baby. You may also hand express a few drops of breast milk and gently massage that milk into your nipples and  allow the milk to air-dry.  In the first few weeks after giving birth, some women experience breast engorgement. Engorgement can make your breasts feel heavy, warm, and tender to the touch. Engorgement peaks within 3-5 days after you give birth. The following recommendations can help to ease engorgement:  Completely empty your breasts while breastfeeding or pumping. You may want to start by applying warm, moist heat (in the shower or with warm, water-soaked hand towels) just before feeding or pumping. This increases circulation and helps the milk flow. If your baby does not completely empty your breasts while breastfeeding, pump any extra milk after he or she is finished.  Apply ice packs to your breasts immediately after breastfeeding or pumping, unless this is too uncomfortable for you. To do this: ? Put ice in a plastic bag. ? Place a towel between your skin and the bag. ? Leave the ice on for 20 minutes, 2-3 times a day.  Make sure that your baby is latched on and positioned properly while breastfeeding.  If engorgement persists after 48 hours of following these recommendations, contact your health care provider or a lactation consultant. Overall health care recommendations while breastfeeding  Eat 3 healthy meals and 3 snacks every day. Well-nourished mothers who are breastfeeding need an additional 450-500 calories a day. You can meet this requirement by increasing the amount of a balanced diet that you eat.  Drink enough water to keep your urine pale yellow or clear.  Rest often, relax, and continue to take your prenatal vitamins to prevent fatigue, stress, and low vitamin and mineral levels in your body (nutrient deficiencies).  Do not use any products that contain nicotine or tobacco, such as cigarettes and e-cigarettes. Your baby may be harmed by chemicals from cigarettes that pass into breast milk and exposure to secondhand smoke. If you need help quitting, ask your health care  provider.  Avoid alcohol.  Do not use illegal drugs or marijuana.  Talk with your health care provider before taking any medicines. These include over-the-counter and prescription medicines as well as vitamins and herbal supplements. Some medicines that may be harmful to your baby can pass through breast milk.  It is possible to become pregnant while breastfeeding. If birth control is desired, ask your health care provider about options that will be safe while breastfeeding your baby. Where to find more information: [employer]: www.llli.org Contact a health care provider if:  You feel like you want to stop breastfeeding or have become frustrated with breastfeeding.  Your nipples are cracked or bleeding.  Your breasts are red, tender, or warm.  You have: ? Painful breasts or nipples. ? A swollen area on either breast. ? A fever or chills. ? Nausea or vomiting. ? Drainage other than breast milk from your nipples.  Your breasts do not become full before feedings by the fifth day after you give birth.  You feel sad and depressed.  Your baby is: ? Too sleepy to eat well. ? Having trouble sleeping. ? More than 1 week old and wetting fewer than 6 diapers in a 24-hour period. ? Not gaining weight by 5 days of age.  Your baby has fewer than 3 stools in a   24-hour period.  Your baby's skin or the white parts of his or her eyes become yellow. Get help right away if:  Your baby is overly tired (lethargic) and does not want to wake up and feed.  Your baby develops an unexplained fever. Summary  Breastfeeding offers many health benefits for infant and mothers.  Try to breastfeed your infant when he or she shows early signs of hunger.  Gently tickle or stroke your baby's lips with your finger or nipple to allow the baby to open his or her mouth. Bring the baby to your breast. Make sure that much of the areola is in your baby's mouth. Offer one side and burp the  baby before you offer the other side.  Talk with your health care provider or lactation consultant if you have questions or you face problems as you breastfeed. This information is not intended to replace advice given to you by your health care provider. Make sure you discuss any questions you have with your health care provider. Document Released: 07/22/2005 Document Revised: 08/23/2016 Document Reviewed: 08/23/2016 Elsevier Interactive Patient Education  2018 Elsevier Inc.  

## 2018-04-16 NOTE — Progress Notes (Signed)
Patient is in the office for initial ob visit, lives with FOB, no complaints today.

## 2018-04-17 LAB — OBSTETRIC PANEL, INCLUDING HIV
Antibody Screen: NEGATIVE
BASOS ABS: 0 10*3/uL (ref 0.0–0.2)
Basos: 0 %
EOS (ABSOLUTE): 0.1 10*3/uL (ref 0.0–0.4)
Eos: 1 %
HEP B S AG: NEGATIVE
HIV SCREEN 4TH GENERATION: NONREACTIVE
Hematocrit: 33.7 % — ABNORMAL LOW (ref 34.0–46.6)
Hemoglobin: 11.2 g/dL (ref 11.1–15.9)
IMMATURE GRANULOCYTES: 0 %
Immature Grans (Abs): 0 10*3/uL (ref 0.0–0.1)
LYMPHS ABS: 1.8 10*3/uL (ref 0.7–3.1)
Lymphs: 21 %
MCH: 27.3 pg (ref 26.6–33.0)
MCHC: 33.2 g/dL (ref 31.5–35.7)
MCV: 82 fL (ref 79–97)
MONOS ABS: 0.4 10*3/uL (ref 0.1–0.9)
Monocytes: 5 %
NEUTROS ABS: 6.5 10*3/uL (ref 1.4–7.0)
NEUTROS PCT: 73 %
PLATELETS: 195 10*3/uL (ref 150–450)
RBC: 4.11 x10E6/uL (ref 3.77–5.28)
RDW: 14.4 % (ref 12.3–15.4)
RPR Ser Ql: NONREACTIVE
Rh Factor: POSITIVE
Rubella Antibodies, IGG: 2.79 index (ref >0.99)
WBC: 8.9 10*3/uL (ref 3.4–10.8)

## 2018-04-18 LAB — URINE CULTURE, OB REFLEX

## 2018-04-18 LAB — CULTURE, OB URINE

## 2018-04-20 LAB — CYTOLOGY - PAP
CHLAMYDIA, DNA PROBE: NEGATIVE
Diagnosis: NEGATIVE
HPV (WINDOPATH): NOT DETECTED
NEISSERIA GONORRHEA: NEGATIVE

## 2018-04-21 LAB — HEMOGLOBINOPATHY EVALUATION
HEMOGLOBIN A2 QUANTITATION: 2.4 % (ref 1.8–3.2)
HGB C: 0 %
HGB S: 0 %
HGB VARIANT: 0 %
Hemoglobin F Quantitation: 0 % (ref 0.0–2.0)
Hgb A: 97.6 % (ref 96.4–98.8)

## 2018-04-24 LAB — SMN1 COPY NUMBER ANALYSIS (SMA CARRIER SCREENING)

## 2018-04-25 LAB — CYSTIC FIBROSIS MUTATION 97: GENE DIS ANAL CARRIER INTERP BLD/T-IMP: NOT DETECTED

## 2018-05-14 ENCOUNTER — Ambulatory Visit (INDEPENDENT_AMBULATORY_CARE_PROVIDER_SITE_OTHER): Payer: PRIVATE HEALTH INSURANCE | Admitting: Obstetrics and Gynecology

## 2018-05-14 ENCOUNTER — Encounter: Payer: Self-pay | Admitting: Obstetrics and Gynecology

## 2018-05-14 VITALS — BP 127/73 | HR 79 | Wt 234.0 lb

## 2018-05-14 DIAGNOSIS — Z3400 Encounter for supervision of normal first pregnancy, unspecified trimester: Secondary | ICD-10-CM

## 2018-05-14 MED ORDER — PRENATAL 27-0.8 MG PO TABS: 1.0000 | ORAL_TABLET | Freq: Every day | ORAL | 0 refills | Status: DC

## 2018-05-14 MED ORDER — PRENATAL 27-0.8 MG PO TABS: 1.0000 | ORAL_TABLET | Freq: Every day | ORAL | 11 refills | Status: AC

## 2018-05-14 NOTE — Progress Notes (Signed)
Trying to quit smoking wants to discuss Nicotine  gum to help while pregnant.  Pt needs refill on PNV's.

## 2018-05-14 NOTE — Progress Notes (Signed)
   PRENATAL VISIT NOTE  Subjective:  Christine Mullen is a 32 y.o. G1P0 at [redacted]w[redacted]d being seen today for ongoing prenatal care.  She is currently monitored for the following issues for this low-risk pregnancy and has Supervision of normal first pregnancy, antepartum on their problem list.  Patient reports occsaional cramping.  Contractions: Not present. Vag. Bleeding: None.  Movement: Present. Denies leaking of fluid.   The following portions of the patient's history were reviewed and updated as appropriate: allergies, current medications, past family history, past medical history, past social history, past surgical history and problem list. Problem list updated.  Objective:   Vitals:   05/14/18 1605  BP: 127/73  Pulse: 79  Weight: 234 lb (106.1 kg)    Fetal Status: Fetal Heart Rate (bpm): 158   Movement: Present     General:  Alert, oriented and cooperative. Patient is in no acute distress.  Skin: Skin is warm and dry. No rash noted.   Cardiovascular: Normal heart rate noted  Respiratory: Normal respiratory effort, no problems with respiration noted  Abdomen: Soft, gravid, appropriate for gestational age.  Pain/Pressure: Absent     Pelvic: Cervical exam deferred        Extremities: Normal range of motion.  Edema: None  Mental Status: Normal mood and affect. Normal behavior. Normal judgment and thought content.   Assessment and Plan:  Pregnancy: G1P0 at [redacted]w[redacted]d  1. Supervision of normal first pregnancy, antepartum - AFP, Serum, Open Spina Bifida Declines flu  Preterm labor symptoms and general obstetric precautions including but not limited to vaginal bleeding, contractions, leaking of fluid and fetal movement were reviewed in detail with the patient. Please refer to After Visit Summary for other counseling recommendations.  Return in about 4 weeks (around 06/11/2018) for OB visit.  Future Appointments  Date Time Provider Department Center  06/10/2018  2:00 PM [facility] [place] 3 [facility]  [facility]    Conan Bowens, MD

## 2018-05-16 LAB — AFP, SERUM, OPEN SPINA BIFIDA
AFP MoM: 1.33
AFP Value: 32.8 ng/mL
GEST. AGE ON COLLECTION DATE: 15.1 wk
Maternal Age At EDD: 30.8 yr
OSBR Risk 1 IN: 8798
Test Results:: NEGATIVE
WEIGHT: 234 [lb_av]

## 2018-06-03 ENCOUNTER — Encounter (HOSPITAL_COMMUNITY): Payer: Self-pay

## 2018-06-10 ENCOUNTER — Encounter (HOSPITAL_COMMUNITY): Payer: Self-pay

## 2018-06-10 ENCOUNTER — Ambulatory Visit (HOSPITAL_COMMUNITY)
Admission: RE | Admit: 2018-06-10 | Discharge: 2018-06-10 | Disposition: A | Discharge status: Home / Self Care | Payer: PRIVATE HEALTH INSURANCE | Source: Ambulatory Visit | Attending: Obstetrics and Gynecology | Admitting: Obstetrics and Gynecology

## 2018-06-10 DIAGNOSIS — Z3A19 19 weeks gestation of pregnancy: Secondary | ICD-10-CM | POA: Diagnosis not present

## 2018-06-10 DIAGNOSIS — O99212 Obesity complicating pregnancy, second trimester: Secondary | ICD-10-CM | POA: Diagnosis present

## 2018-06-10 DIAGNOSIS — O99332 Smoking (tobacco) complicating pregnancy, second trimester: Secondary | ICD-10-CM | POA: Diagnosis not present

## 2018-06-10 DIAGNOSIS — Z3400 Encounter for supervision of normal first pregnancy, unspecified trimester: Secondary | ICD-10-CM

## 2018-06-10 DIAGNOSIS — Z363 Encounter for antenatal screening for malformations: Secondary | ICD-10-CM | POA: Diagnosis not present

## 2018-06-10 HISTORY — DX: Other specified health status: Z78.9

## 2018-06-11 ENCOUNTER — Other Ambulatory Visit (HOSPITAL_COMMUNITY): Payer: Self-pay | Admitting: *Deleted

## 2018-06-11 ENCOUNTER — Encounter: Payer: Self-pay | Admitting: Obstetrics and Gynecology

## 2018-06-11 ENCOUNTER — Ambulatory Visit (INDEPENDENT_AMBULATORY_CARE_PROVIDER_SITE_OTHER): Payer: PRIVATE HEALTH INSURANCE | Admitting: Obstetrics and Gynecology

## 2018-06-11 DIAGNOSIS — Z362 Encounter for other antenatal screening follow-up: Secondary | ICD-10-CM

## 2018-06-11 DIAGNOSIS — Z3400 Encounter for supervision of normal first pregnancy, unspecified trimester: Secondary | ICD-10-CM

## 2018-06-11 NOTE — Progress Notes (Signed)
Pt presents for ROB with no complaints today.  

## 2018-06-11 NOTE — Progress Notes (Signed)
   PRENATAL VISIT NOTE  Subjective:  Christine Mullen is a 32 y.o. G1P0 at [redacted]w[redacted]d being seen today for ongoing prenatal care.  She is currently monitored for the following issues for this low-risk pregnancy and has Supervision of normal first pregnancy, antepartum on their problem list.  Patient reports no complaints.  Contractions: Not present. Vag. Bleeding: None.  Movement: Present. Denies leaking of fluid.   The following portions of the patient's history were reviewed and updated as appropriate: allergies, current medications, past family history, past medical history, past social history, past surgical history and problem list. Problem list updated.  Objective:   Vitals:   06/11/18 1022  BP: 129/73  Pulse: 78  Weight: 240 lb 1.6 oz (108.9 kg)    Fetal Status: Fetal Heart Rate (bpm): 152   Movement: Present     General:  Alert, oriented and cooperative. Patient is in no acute distress.  Skin: Skin is warm and dry. No rash noted.   Cardiovascular: Normal heart rate noted  Respiratory: Normal respiratory effort, no problems with respiration noted  Abdomen: Soft, gravid, appropriate for gestational age.  Pain/Pressure: Absent     Pelvic: Cervical exam deferred        Extremities: Normal range of motion.  Edema: None  Mental Status: Normal mood and affect. Normal behavior. Normal judgment and thought content.   Assessment and Plan:  Pregnancy: G1P0 at [redacted]w[redacted]d  1. Supervision of normal first pregnancy, antepartum Patient is doing well without complaints Follow up anatomy ultrasound Reviewed ultrasound and AFP results  Preterm labor symptoms and general obstetric precautions including but not limited to vaginal bleeding, contractions, leaking of fluid and fetal movement were reviewed in detail with the patient. Please refer to After Visit Summary for other counseling recommendations.  Return in about 4 weeks (around 07/09/2018) for ROB.  Future Appointments  Date Time Provider  Department Center  07/08/2018  2:30 PM [facility] [place] 1 [facility]    Catalina Antigua, MD

## 2018-07-08 ENCOUNTER — Ambulatory Visit (HOSPITAL_COMMUNITY)
Admission: RE | Admit: 2018-07-08 | Discharge: 2018-07-08 | Disposition: A | Discharge status: Home / Self Care | Payer: PRIVATE HEALTH INSURANCE | Source: Ambulatory Visit | Attending: Obstetrics and Gynecology | Admitting: Obstetrics and Gynecology

## 2018-07-08 ENCOUNTER — Encounter (HOSPITAL_COMMUNITY): Payer: Self-pay

## 2018-07-08 DIAGNOSIS — O99332 Smoking (tobacco) complicating pregnancy, second trimester: Secondary | ICD-10-CM | POA: Insufficient documentation

## 2018-07-08 DIAGNOSIS — O99212 Obesity complicating pregnancy, second trimester: Secondary | ICD-10-CM | POA: Diagnosis present

## 2018-07-08 DIAGNOSIS — Z362 Encounter for other antenatal screening follow-up: Secondary | ICD-10-CM | POA: Diagnosis not present

## 2018-07-08 DIAGNOSIS — Z3A23 23 weeks gestation of pregnancy: Secondary | ICD-10-CM | POA: Diagnosis not present

## 2018-07-09 ENCOUNTER — Encounter: Payer: PRIVATE HEALTH INSURANCE | Admitting: Obstetrics and Gynecology

## 2018-07-10 ENCOUNTER — Other Ambulatory Visit (HOSPITAL_COMMUNITY): Payer: Self-pay | Admitting: *Deleted

## 2018-07-10 DIAGNOSIS — O99212 Obesity complicating pregnancy, second trimester: Secondary | ICD-10-CM

## 2018-07-15 ENCOUNTER — Ambulatory Visit (INDEPENDENT_AMBULATORY_CARE_PROVIDER_SITE_OTHER): Payer: PRIVATE HEALTH INSURANCE | Admitting: Certified Nurse Midwife

## 2018-07-15 DIAGNOSIS — Z3400 Encounter for supervision of normal first pregnancy, unspecified trimester: Secondary | ICD-10-CM

## 2018-07-15 DIAGNOSIS — Z3402 Encounter for supervision of normal first pregnancy, second trimester: Secondary | ICD-10-CM

## 2018-07-15 NOTE — Progress Notes (Signed)
Pt is here for ROB G1P0 637w0d.

## 2018-07-15 NOTE — Patient Instructions (Signed)

## 2018-07-15 NOTE — Progress Notes (Signed)
   PRENATAL VISIT NOTE  Subjective:  Christine Mullen is a 32 y.o. G1P0 at 3767w0d being seen today for ongoing prenatal care.  She is currently monitored for the following issues for this low-risk pregnancy and has Supervision of normal first pregnancy, antepartum on their problem list.  Patient reports no complaints.  Contractions: Not present. Vag. Bleeding: None.  Movement: Present. Denies leaking of fluid.   The following portions of the patient's history were reviewed and updated as appropriate: allergies, current medications, past family history, past medical history, past social history, past surgical history and problem list. Problem list updated.  Objective:   Vitals:   07/15/18 1605  BP: 122/79  Pulse: 74  Weight: 244 lb 9.6 oz (110.9 kg)    Fetal Status: Fetal Heart Rate (bpm): 152 Fundal Height: 26 cm Movement: Present     General:  Alert, oriented and cooperative. Patient is in no acute distress.  Skin: Skin is warm and dry. No rash noted.   Cardiovascular: Normal heart rate noted  Respiratory: Normal respiratory effort, no problems with respiration noted  Abdomen: Soft, gravid, appropriate for gestational age.  Pain/Pressure: Absent     Pelvic: Cervical exam deferred        Extremities: Normal range of motion.  Edema: None  Mental Status: Normal mood and affect. Normal behavior. Normal judgment and thought content.   Assessment and Plan:  Pregnancy: G1P0 at 4867w0d  1. Supervision of normal first pregnancy, antepartum - patient doing well, no complaints  - Anticipatory guidance on upcoming appointments with GTT and labs at next appointments - Discussed fasting for next appointment, patient verbalizes understanding - 32 lb weight gain during pregnancy, educated and discussed recommendations during pregnancy, exercising and healthy eating   Preterm labor symptoms and general obstetric precautions including but not limited to vaginal bleeding, contractions, leaking of  fluid and fetal movement were reviewed in detail with the patient. Please refer to After Visit Summary for other counseling recommendations.  Return in about 4 weeks (around 08/12/2018) for ROB/2hrGTT/labs.  Future Appointments  Date Time Provider Department Center  08/06/2018  3:45 PM [facility] [place] 2 [facility]  08/12/2018  8:00 AM [facility] None  08/12/2018  8:15 AM Willodean RosenthalHarraway-Smith, Carolyn, MD [facility] None    Sharyon CableVeronica C Brieann Osinski, CNM

## 2018-08-05 NOTE — L&D Delivery Note (Addendum)
Delivery Note At 1:13 AM a viable female was delivered via Vaginal, Spontaneous (Presentation:Cephalic ;OA  ).  APGAR: 8, 9; weight 4 lb 12 oz (2155 g).   Placenta status: Spontaneous , Complete .  Cord: 3 vessel .  Cord pH: NA  Was initiallly called for x2 severe range blood pressures 167/74 and then a repeat 15 mins later of 170/74. Preeclampsia focused orders were made but then patient felt perineal pressure and expressed the desire to push and was assessed to be 10cm and fully effaced. After minimal maternal pushing infanct was delivered over intact perineum. Uterus was hemostatic and firm after Oxytocin and uterine massage after the 3rd stage of labour.    Anesthesia: None   Episiotomy: None Lacerations: None Suture Repair: no repair  Est. Blood Loss (mL): 150  Mom to postpartum.  Baby to Couplet care / Skin to Skin.  Sandi Raveling MD PGY 1 [facility]  10/15/2018, 5:26 AM  Attestation: I have seen this patient and agree with the resident's documentation. I have examined them separately, and we have discussed the plan of care.  Cristal Deer. Earlene Plater, DO OB/GYN Fellow

## 2018-08-06 ENCOUNTER — Ambulatory Visit (HOSPITAL_COMMUNITY): Admission: RE | Admit: 2018-08-06 | Payer: PRIVATE HEALTH INSURANCE | Source: Ambulatory Visit

## 2018-08-07 ENCOUNTER — Ambulatory Visit (HOSPITAL_COMMUNITY)
Admission: RE | Admit: 2018-08-07 | Discharge: 2018-08-07 | Disposition: A | Discharge status: Home / Self Care | Payer: PRIVATE HEALTH INSURANCE | Source: Ambulatory Visit | Attending: Obstetrics and Gynecology | Admitting: Obstetrics and Gynecology

## 2018-08-07 ENCOUNTER — Other Ambulatory Visit (HOSPITAL_COMMUNITY): Payer: Self-pay | Admitting: *Deleted

## 2018-08-07 ENCOUNTER — Encounter (HOSPITAL_COMMUNITY): Payer: Self-pay

## 2018-08-07 ENCOUNTER — Other Ambulatory Visit (HOSPITAL_COMMUNITY): Payer: Self-pay | Admitting: Maternal & Fetal Medicine

## 2018-08-07 DIAGNOSIS — O99332 Smoking (tobacco) complicating pregnancy, second trimester: Secondary | ICD-10-CM | POA: Diagnosis not present

## 2018-08-07 DIAGNOSIS — Z3A27 27 weeks gestation of pregnancy: Secondary | ICD-10-CM

## 2018-08-07 DIAGNOSIS — O99212 Obesity complicating pregnancy, second trimester: Secondary | ICD-10-CM | POA: Diagnosis not present

## 2018-08-07 DIAGNOSIS — Z362 Encounter for other antenatal screening follow-up: Secondary | ICD-10-CM | POA: Diagnosis not present

## 2018-08-12 ENCOUNTER — Encounter: Payer: Self-pay | Admitting: Obstetrics & Gynecology

## 2018-08-12 ENCOUNTER — Other Ambulatory Visit: Payer: PRIVATE HEALTH INSURANCE

## 2018-08-12 ENCOUNTER — Ambulatory Visit (INDEPENDENT_AMBULATORY_CARE_PROVIDER_SITE_OTHER): Payer: PRIVATE HEALTH INSURANCE | Admitting: Obstetrics & Gynecology

## 2018-08-12 DIAGNOSIS — Z3403 Encounter for supervision of normal first pregnancy, third trimester: Secondary | ICD-10-CM

## 2018-08-12 DIAGNOSIS — Z3400 Encounter for supervision of normal first pregnancy, unspecified trimester: Secondary | ICD-10-CM

## 2018-08-12 DIAGNOSIS — Z23 Encounter for immunization: Secondary | ICD-10-CM | POA: Diagnosis not present

## 2018-08-12 DIAGNOSIS — Z3A28 28 weeks gestation of pregnancy: Secondary | ICD-10-CM

## 2018-08-12 NOTE — Progress Notes (Signed)
Patient reports good fetal movement, denies pain. 

## 2018-08-12 NOTE — Addendum Note (Signed)
Addended by: Natale Milch D on: 08/12/2018 09:36 AM   Modules accepted: Orders

## 2018-08-12 NOTE — Progress Notes (Signed)
   PRENATAL VISIT NOTE  Subjective:  Christine Mullen is a 33 y.o. G1P0 at 434w0d being seen today for ongoing prenatal care.  She is currently monitored for the following issues for this low-risk pregnancy and has Supervision of normal first pregnancy, antepartum on their problem list.  Patient reports no complaints.  Contractions: Not present. Vag. Bleeding: None.  Movement: Present. Denies leaking of fluid.   The following portions of the patient's history were reviewed and updated as appropriate: allergies, current medications, past family history, past medical history, past social history, past surgical history and problem list. Problem list updated.  Objective:   Vitals:   08/12/18 0824  BP: 124/82  Pulse: 65  Weight: 250 lb 1.6 oz (113.4 kg)    Fetal Status: Fetal Heart Rate (bpm): 145   Movement: Present     General:  Alert, oriented and cooperative. Patient is in no acute distress.  Skin: Skin is warm and dry. No rash noted.   Cardiovascular: Normal heart rate noted  Respiratory: Normal respiratory effort, no problems with respiration noted  Abdomen: Soft, gravid, appropriate for gestational age.  Pain/Pressure: Absent     Pelvic: Cervical exam deferred        Extremities: Normal range of motion.  Edema: None  Mental Status: Normal mood and affect. Normal behavior. Normal judgment and thought content.   Assessment and Plan:  Pregnancy: G1P0 at 624w0d  1. Supervision of normal first pregnancy, antepartum Reviewed US from 08/10/2018 Pt requests Flu and Tdap for today after counseling reviewed contraception- pt will read info on LnIUD 28 week labs today   Preterm labor symptoms and general obstetric precautions including but not limited to vaginal bleeding, contractions, leaking of fluid and fetal movement were reviewed in detail with the patient. Please refer to After Visit Summary for other counseling recommendations.  Return in about 2 weeks (around 08/26/2018).  Future  Appointments  Date Time Provider Department Center  09/04/2018  2:30 PM [facility] 1 [facility]    Willodean Rosenthalarolyn Harraway-Smith, MD

## 2018-08-12 NOTE — Patient Instructions (Signed)

## 2018-08-13 ENCOUNTER — Encounter: Payer: Self-pay | Admitting: Obstetrics & Gynecology

## 2018-08-13 DIAGNOSIS — O99019 Anemia complicating pregnancy, unspecified trimester: Secondary | ICD-10-CM | POA: Insufficient documentation

## 2018-08-13 LAB — CBC
HEMATOCRIT: 32.3 % — AB (ref 34.0–46.6)
Hemoglobin: 10.6 g/dL — ABNORMAL LOW (ref 11.1–15.9)
MCH: 27.9 pg (ref 26.6–33.0)
MCHC: 32.8 g/dL (ref 31.5–35.7)
MCV: 85 fL (ref 79–97)
PLATELETS: 199 10*3/uL (ref 150–450)
RBC: 3.8 x10E6/uL (ref 3.77–5.28)
RDW: 13.1 % (ref 11.7–15.4)
WBC: 7.7 10*3/uL (ref 3.4–10.8)

## 2018-08-13 LAB — HIV ANTIBODY (ROUTINE TESTING W REFLEX): HIV Screen 4th Generation wRfx: NONREACTIVE

## 2018-08-13 LAB — GLUCOSE TOLERANCE, 2 HOURS W/ 1HR
GLUCOSE, 1 HOUR: 134 mg/dL (ref 65–179)
GLUCOSE, FASTING: 88 mg/dL (ref 65–91)
Glucose, 2 hour: 102 mg/dL (ref 65–152)

## 2018-08-13 LAB — RPR: RPR Ser Ql: NONREACTIVE

## 2018-08-26 ENCOUNTER — Encounter: Payer: PRIVATE HEALTH INSURANCE | Admitting: Obstetrics and Gynecology

## 2018-09-02 ENCOUNTER — Other Ambulatory Visit: Payer: Self-pay

## 2018-09-02 ENCOUNTER — Ambulatory Visit (INDEPENDENT_AMBULATORY_CARE_PROVIDER_SITE_OTHER): Payer: PRIVATE HEALTH INSURANCE

## 2018-09-02 VITALS — BP 110/69 | HR 59 | Wt 255.7 lb

## 2018-09-02 DIAGNOSIS — Z3403 Encounter for supervision of normal first pregnancy, third trimester: Secondary | ICD-10-CM

## 2018-09-02 DIAGNOSIS — O99013 Anemia complicating pregnancy, third trimester: Secondary | ICD-10-CM

## 2018-09-02 DIAGNOSIS — Z3400 Encounter for supervision of normal first pregnancy, unspecified trimester: Secondary | ICD-10-CM

## 2018-09-02 DIAGNOSIS — O99019 Anemia complicating pregnancy, unspecified trimester: Secondary | ICD-10-CM

## 2018-09-02 MED ORDER — FERROUS SULFATE 325 (65 FE) MG PO TABS: 325.0000 mg | ORAL_TABLET | Freq: Two times a day (BID) | ORAL | 2 refills | Status: AC

## 2018-09-02 NOTE — Addendum Note (Signed)
Addended by: Gerrit Heck L on: 09/02/2018 03:40 PM   Modules accepted: Orders

## 2018-09-02 NOTE — Progress Notes (Signed)
   PRENATAL VISIT NOTE  Subjective:  Christine Mullen is a 33 y.o. G1P0 at [redacted]w[redacted]d who presents today for routine prenatal care.  She is currently being monitored for supervision of a low-risk pregnancy with problems as listed below.  Patient has no pregnancy related concerns and endorses fetal movement.  She denies vaginal concerns including discharge, bleeding, leaking, itching, and burning. Patient does report some swelling in her feet at night and questions if she can use epsom salt soaks.  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   Patient Active Problem List   Diagnosis Date Noted  . Anemia, antepartum 08/13/2018  . Supervision of normal first pregnancy, antepartum 04/16/2018    The following portions of the patient's history were reviewed and updated as appropriate: allergies, current medications, past family history, past medical history, past social history, past surgical history and problem list. Problem list updated.  Objective:   Vitals:   09/02/18 1514  BP: 110/69  Pulse: (!) 59  Weight: 255 lb 11.2 oz (116 kg)    Fetal Status: Fetal Heart Rate (bpm): 146 Fundal Height: 30 cm Movement: Present     General:  Alert, oriented and cooperative. Patient is in no acute distress.  Skin: Skin is warm and dry.   Cardiovascular: Regular rate and rhythm.  Respiratory: Normal respiratory effort. CTA-Bilaterally  Abdomen: Soft, gravid, appropriate for gestational age.  Pelvic: Cervical exam deferred        Extremities: Normal range of motion.  Edema: Trace  Mental Status: Normal mood and affect. Normal behavior. Normal judgment and thought content.   Assessment and Plan:  Pregnancy: G1P0 at [redacted]w[redacted]d  1. Supervision of normal first pregnancy, antepartum -Reviewed 28 week lab results -Discussed edema after working on feet all day and okay for epsom salt soaks *Encouraged supportive measures including shoe  inserts *Rest with elevation of feet *Proper hydration -Anticipatory guidance for upcoming appointments -RTO in 2 weeks   2. Anemia, Antepartum -Discussed abnormal HgB results and recommendation to initiate Fe supplementation -Educated on administration and side effects including; *Taking twice daily with orange juice *Increased risk of constipation *Potential for dark stools.   -Rx for FeSO4 sent to pharmacy on file. Disp 60, RF 2  Preterm labor symptoms and general obstetric precautions including but not limited to vaginal bleeding, contractions, leaking of fluid and fetal movement were reviewed with the patient.  Please refer to After Visit Summary for other counseling recommendations.  Return in about 2 weeks (around 09/16/2018) for ROB.  Future Appointments  Date Time Provider Department Center  09/04/2018  2:30 PM [facility] [place] 1 [facility]    Cherre Robins, CNM 09/02/2018, 3:32 PM

## 2018-09-02 NOTE — Patient Instructions (Signed)

## 2018-09-04 ENCOUNTER — Ambulatory Visit (HOSPITAL_COMMUNITY)
Admission: RE | Admit: 2018-09-04 | Discharge: 2018-09-04 | Disposition: A | Discharge status: Home / Self Care | Payer: PRIVATE HEALTH INSURANCE | Source: Ambulatory Visit | Attending: Obstetrics and Gynecology | Admitting: Obstetrics and Gynecology

## 2018-09-04 ENCOUNTER — Encounter (HOSPITAL_COMMUNITY): Payer: Self-pay

## 2018-09-04 ENCOUNTER — Other Ambulatory Visit (HOSPITAL_COMMUNITY): Payer: Self-pay | Admitting: *Deleted

## 2018-09-04 DIAGNOSIS — O99213 Obesity complicating pregnancy, third trimester: Secondary | ICD-10-CM

## 2018-09-04 DIAGNOSIS — O365930 Maternal care for other known or suspected poor fetal growth, third trimester, not applicable or unspecified: Secondary | ICD-10-CM | POA: Diagnosis not present

## 2018-09-04 DIAGNOSIS — Z362 Encounter for other antenatal screening follow-up: Secondary | ICD-10-CM | POA: Diagnosis not present

## 2018-09-04 DIAGNOSIS — O99019 Anemia complicating pregnancy, unspecified trimester: Secondary | ICD-10-CM | POA: Diagnosis present

## 2018-09-04 DIAGNOSIS — O365990 Maternal care for other known or suspected poor fetal growth, unspecified trimester, not applicable or unspecified: Secondary | ICD-10-CM

## 2018-09-04 DIAGNOSIS — O99333 Smoking (tobacco) complicating pregnancy, third trimester: Secondary | ICD-10-CM

## 2018-09-04 DIAGNOSIS — Z3A31 31 weeks gestation of pregnancy: Secondary | ICD-10-CM

## 2018-09-16 ENCOUNTER — Encounter: Payer: PRIVATE HEALTH INSURANCE | Admitting: Nurse Practitioner

## 2018-09-18 ENCOUNTER — Encounter: Payer: Self-pay | Admitting: Medical

## 2018-09-18 ENCOUNTER — Ambulatory Visit (INDEPENDENT_AMBULATORY_CARE_PROVIDER_SITE_OTHER): Payer: PRIVATE HEALTH INSURANCE | Admitting: Medical

## 2018-09-18 VITALS — BP 140/84 | HR 101 | Wt 258.0 lb

## 2018-09-18 DIAGNOSIS — Z3A33 33 weeks gestation of pregnancy: Secondary | ICD-10-CM

## 2018-09-18 DIAGNOSIS — O133 Gestational [pregnancy-induced] hypertension without significant proteinuria, third trimester: Secondary | ICD-10-CM | POA: Insufficient documentation

## 2018-09-18 DIAGNOSIS — O99019 Anemia complicating pregnancy, unspecified trimester: Secondary | ICD-10-CM

## 2018-09-18 DIAGNOSIS — O99213 Obesity complicating pregnancy, third trimester: Secondary | ICD-10-CM

## 2018-09-18 DIAGNOSIS — Z3403 Encounter for supervision of normal first pregnancy, third trimester: Secondary | ICD-10-CM

## 2018-09-18 DIAGNOSIS — Z3400 Encounter for supervision of normal first pregnancy, unspecified trimester: Secondary | ICD-10-CM

## 2018-09-18 DIAGNOSIS — O99013 Anemia complicating pregnancy, third trimester: Secondary | ICD-10-CM

## 2018-09-18 NOTE — Progress Notes (Signed)
PRENATAL VISIT NOTE  Subjective:  Christine Mullen is a 33 y.o. G1P0 at 54w2dbeing seen today for ongoing prenatal care.  She is currently monitored for the following issues for this high-risk pregnancy and has Supervision of normal first pregnancy, antepartum; Anemia, antepartum; Transient hypertension of pregnancy in third trimester; and Obesity in pregnancy, antepartum, third trimester on their problem list.  Patient reports LE edema.  Contractions: Not present. Vag. Bleeding: None.  Movement: Present. Denies leaking of fluid.   The following portions of the patient's history were reviewed and updated as appropriate: allergies, current medications, past family history, past medical history, past social history, past surgical history and problem list. Problem list updated.  Objective:   Vitals:   09/18/18 1023  BP: 140/84  Pulse: (!) 101  Weight: 258 lb (117 kg)    Fetal Status: Fetal Heart Rate (bpm): 142 Fundal Height: 36 cm Movement: Present     General:  Alert, oriented and cooperative. Patient is in no acute distress.  Skin: Skin is warm and dry. No rash noted.   Cardiovascular: Normal heart rate noted  Respiratory: Normal respiratory effort, no problems with respiration noted  Abdomen: Soft, gravid, appropriate for gestational age.  Pain/Pressure: Absent     Pelvic: Cervical exam deferred        Extremities: Normal range of motion.  Edema: Deep pitting, indentation remains for a short time  Mental Status: Normal mood and affect. Normal behavior. Normal judgment and thought content.   Assessment and Plan:  Pregnancy: G1P0 at 372w2d1. Supervision of normal first pregnancy, antepartum  2. Anemia, antepartum - taking Iron supplement  3. Gestational hypertension, third trimester - Patient had elevated BP at MFM on 1/31 and again today - Denies HA, visual changes or RUQ pain - CBC - Comp Met (CMET) - Protein / creatinine ratio, urine - Pre-eclampsia warning signs  discussed   5. Obesity in pregnancy, antepartum, third trimester - Growth [place] 09[place]18  Preterm labor symptoms and general obstetric precautions including but not limited to vaginal bleeding, contractions, leaking of fluid and fetal movement were reviewed in detail with the patient. Please refer to After Visit Summary for other counseling recommendations.  [facility] about 2 weeks (around [facility]02/2018[facility]  Date Time Provider [place]21/2020  3:30 PM [facility] [place] [facility]    JuKerry HoughPA-C

## 2018-09-18 NOTE — Progress Notes (Signed)
CC: B/P elevated today pt notes swelling , pt denies any visual changes or HA's

## 2018-09-18 NOTE — Patient Instructions (Addendum)
Braxton Hicks Contractions Contractions of the uterus can occur throughout pregnancy, but they are not always a sign that you are in labor. You may have practice contractions called Braxton Hicks contractions. These false labor contractions are sometimes confused with true labor. What are Braxton Hicks contractions? Braxton Hicks contractions are tightening movements that occur in the muscles of the uterus before labor. Unlike true labor contractions, these contractions do not result in opening (dilation) and thinning of the cervix. Toward the end of pregnancy (32-34 weeks), Braxton Hicks contractions can happen more often and may become stronger. These contractions are sometimes difficult to tell apart from true labor because they can be very uncomfortable. You should not feel embarrassed if you go to the hospital with false labor. Sometimes, the only way to tell if you are in true labor is for your health care provider to look for changes in the cervix. The health care provider will do a physical exam and may monitor your contractions. If you are not in true labor, the exam should show that your cervix is not dilating and your water has not broken. If there are no other health problems associated with your pregnancy, it is completely safe for you to be sent home with false labor. You may continue to have Braxton Hicks contractions until you go into true labor. How to tell the difference between true labor and false labor True labor  Contractions last 30-70 seconds.  Contractions become very regular.  Discomfort is usually felt in the top of the uterus, and it spreads to the lower abdomen and low back.  Contractions do not go away with walking.  Contractions usually become more intense and increase in frequency.  The cervix dilates and gets thinner. False labor  Contractions are usually shorter and not as strong as true labor contractions.  Contractions are usually irregular.  Contractions  are often felt in the front of the lower abdomen and in the groin.  Contractions may go away when you walk around or change positions while lying down.  Contractions get weaker and are shorter-lasting as time goes on.  The cervix usually does not dilate or become thin. Follow these instructions at home:   Take over-the-counter and prescription medicines only as told by your health care provider.  Keep up with your usual exercises and follow other instructions from your health care provider.  Eat and drink lightly if you think you are going into labor.  If Braxton Hicks contractions are making you uncomfortable: ? Change your position from lying down or resting to walking, or change from walking to resting. ? Sit and rest in a tub of warm water. ? Drink enough fluid to keep your urine pale yellow. Dehydration may cause these contractions. ? Do slow and deep breathing several times an hour.  Keep all follow-up prenatal visits as told by your health care provider. This is important. Contact a health care provider if:  You have a fever.  You have continuous pain in your abdomen. Get help right away if:  Your contractions become stronger, more regular, and closer together.  You have fluid leaking or gushing from your vagina.  You pass blood-tinged mucus (bloody show).  You have bleeding from your vagina.  You have low back pain that you never had before.  You feel your baby's head pushing down and causing pelvic pressure.  Your baby is not moving inside you as much as it used to. Summary  Contractions that occur before labor are   called Braxton Hicks contractions, false labor, or practice contractions.  Braxton Hicks contractions are usually shorter, weaker, farther apart, and less regular than true labor contractions. True labor contractions usually become progressively stronger and regular, and they become more frequent.  Manage discomfort from [facility] Center[facility] Hicks contractions  by changing position, resting in a warm bath, drinking plenty of water, or practicing deep breathing. This information is not intended to replace advice given to you by your health care provider. Make sure you discuss any questions you have with your health care provider. Document Released: 12/05/2016 Document Revised: 05/06/2017 Document Reviewed: 12/05/2016 Elsevier Interactive Patient Education  2019 Elsevier Inc. Fetal Movement Counts Patient Name: ________________________________________________ Patient Due Date: ____________________ What is a fetal movement count?  A fetal movement count is the number of times that you feel your baby move during a certain amount of time. This may also be called a fetal kick count. A fetal movement count is recommended for every pregnant woman. You may be asked to start counting fetal movements as early as week 28 of your pregnancy. Pay attention to when your baby is most active. You may notice your baby's sleep and wake cycles. You may also notice things that make your baby move more. You should do a fetal movement count:  When your baby is normally most active.  At the same time each day. A good time to count movements is while you are resting, after having something to eat and drink. How do I count fetal movements? 1. Find a quiet, comfortable area. Sit, or lie down on your side. 2. Write down the date, the start time and stop time, and the number of movements that you felt between those two times. Take this information with you to your health care visits. 3. For 2 hours, count kicks, flutters, swishes, rolls, and jabs. You should feel at least 10 movements during 2 hours. 4. You may stop counting after you have felt 10 movements. 5. If you do not feel 10 movements in 2 hours, have something to eat and drink. Then, keep resting and counting for 1 hour. If you feel at least 4 movements during that hour, you may stop counting. Contact a health care  provider if:  You feel fewer than 4 movements in 2 hours.  Your baby is not moving like he or she usually does. Date: ____________ Start time: ____________ Stop time: ____________ Movements: ____________ Date: ____________ Start time: ____________ Stop time: ____________ Movements: ____________ Date: ____________ Start time: ____________ Stop time: ____________ Movements: ____________ Date: ____________ Start time: ____________ Stop time: ____________ Movements: ____________ Date: ____________ Start time: ____________ Stop time: ____________ Movements: ____________ Date: ____________ Start time: ____________ Stop time: ____________ Movements: ____________ Date: ____________ Start time: ____________ Stop time: ____________ Movements: ____________ Date: ____________ Start time: ____________ Stop time: ____________ Movements: ____________ Date: ____________ Start time: ____________ Stop time: ____________ Movements: ____________ This information is not intended to replace advice given to you by your health care provider. Make sure you discuss any questions you have with your health care provider. Document Released: 08/21/2006 Document Revised: 03/20/2016 Document Reviewed: 08/31/2015 Elsevier Interactive Patient Education  2019 [employer]  AREA PEDIATRIC/FAMILY PRACTICE PHYSICIANS  [place] ([id number]) . [facility] Melodie Bouillono Chambliss, MD; Lum BabeEniola, MD; Sheffield SliderHale, MD; Leveda AnnaHensel, MD; McDiarmid, MD; Jerene BearsMcIntyer, MD; Jennette KettleNeal, MD; Gwendolyn GrantWalden, MD o [address] [place], [place] [address] o [phone] o Mon-Fri 8:30-12:30, 1:30-5:00 o Providers come to see babies at [facility] o Accepting Medicaid .  [facility] o Limited providers who accept newborns: Docia Chuck, MD; Kateri Plummer, MD; Paulino Rily, MD o [address], [place], [place] [address] o [phone] o Mon-Fri 8:00-5:30 o Babies seen by providers at [facility] o Does NOT  accept Medicaid o Please call early in hospitalization for appointment (limited availability)  . [facility] Fatima Sanger, MD o [address] [place], [place] [address] o [phone] o Mon, Tue, Thur, Fri 8:30-5:00, Wed 10:00-7:00 (closed 1-2pm) o Babies seen by [facility] providers o Accepting Medicaid . Donnie Coffin - Pediatrician Fae Pippin, MD o [address], [place], [place] [address] o [phone] o Mon-Fri 8:30-5:00, Sat 8:30-12:00 o Provider comes to see babies at [facility] o Accepting Medicaid o Must have been referred from current patients or contacted office prior to delivery . [facility]) Leotis Pain, MD; Ave Filter, MD; Luna Fuse, MD; Kennedy Bucker, MD; Konrad Dolores, MD; Kathlene November, MD; Jenne Campus, MD; Lubertha South, MD; Wynetta Emery, MD; Duffy Rhody, MD; Gerre Couch, NP; Shirl Harris, NP o [address], [place], [place] [address] o [phone] o Mon, Tue, Thur, Fri 8:30-5:30, Wed 9:30-5:30, Sat 8:30-12:30 o Babies seen by [facility] providers o Accepting Medicaid o Only accepting infants of first-time parents or siblings of current patients [facility] discharge coordinator will make follow-up appointment . Cyril Mourning o [address] [place], [place]  [address] o [phone]   Fax - [phone] . [facility] o [address], [place], [place]  [address] o Phone - [phone]   Fax - [phone] . Lucio Edward o [address], [place], [place]  [address] o [phone]  East/[place]ort[place] [phone]) . [facility] Jorge Mandril, MD; Alita Chyle, MD; Princella Ion, MD; MD; Earlene Plater, MD; Jamesetta Orleans, MD; Alvera Novel, MD; Clarene Duke, MD; Rana Snare, MD; Carmon Ginsberg, MD; Alinda Money, MD; Hosie Poisson, MD; Mayford Knife, MD o [address], [place], [place] [address] o [phone] o Mon-Fri 8:30-5:00 (extended evenings Mon-Thur as needed), Sat-Sun 10:00-1:00 o Providers come to see babies at  [facility] o Accepting Medicaid for families of first-time babies and families with all children in the household age 36 and under. Must register with office prior to making appointment (M-F only). [facility] Odella Aquas, NP; Lynelle Doctor, MD; Susann Givens, MD; [place], [place] o [address], [place], [place] [address] o [phone] o Mon-Fri 8:00-5:00 o Babies seen by providers at [facility] o Does NOT accept Medicaid/Commercial Insurance Only . [facility] [place] [facility]  Suzette Battiest, MD; Zachery Dauer, MD; Stefan Church, MD; Sabino Dick, MD; Quitman Livings, MD; Farris Has, MD; Gaynell Face, MD; Betha Loa, MD; Colon Flattery, MD; Clifton James, MD o [address] [place], [place] [address] o [phone] o Mon-Fri 8:30-5:30, Sat (Oct.-Mar.) 9:00-1:00 o Babies seen by providers at [facility] o Accepting [facility] [phone]) . [facility] Dimitri, MD; Sheliah Hatch, MD o [address], [place], [place] [address] o [phone] o Mon-Fri 8:30-5:00, Sat 8:30-12:00 o Providers come to see babies at [facility] o Does NOT accept Medicaid . [facility] Cindy Hazy, [place]; [place], MD; [place], [place]; Wynelle Link, MD; Azucena Cecil, MD o [address] [place], [place] [address] o [phone] o Mon-Fri 8:00-5:00 o Babies seen by providers at [facility] o Does NOT accept Medicaid o Only accepting babies of parents who are patients o Please call early in hospitalization for appointment (limited availability) .  [facility] Lamar Beneso Clark, MD; Abran CantorFrye, MD; Early OsmondKelleher, MD; Cherre HugerMack, NP; Hyacinth MeekerMiller, MD; Dwan Bolt'Keller, MD; Jarold MottoPatterson, NP; Dario GuardianPudlo, MD; Talmage NapPuzio, MD; Maisie Fushomas, MD; Pricilla Holmucker, MD; Tama Highwiselton, MD o [address], [place], [place] [address] o [phone] o Mon-Fri 8:00-5:00, Sat 9:00-12:00 o Providers come to see babies at [facility] o Does NOT accept [facility]  [place] [phone]) . [facility] o Limited providers accepting new patients: Drema PryBrake, NP; ShirleyWharton, PA o [address], [place], [place] [address] o [phone] o Mon-Fri 8:00-5:00 o Babies seen by providers at [facility] o Does NOT accept Medicaid o Only accepting babies of parents who are patients o Please call early in hospitalization for appointment (limited availability) . [facility] Luan Pullingo Gay, MD; Nash DimmerQuinlan, MD o [address] [place], [place] [address] o [phone] (press 1 to schedule appointment) o Mon-Fri 8:00-5:00 o Providers come to see babies at [facility] o Does NOT accept Medicaid . [facility] Cristino Marteso Mazer, MD o [address] [place], [place] [address] o [phone] o Mon-Fri 8:30-5:00 (lunch 12:30-1:00), extended hours by appointment only Wed 5:00-6:30 o Babies seen by [facility] providers o Accepting Medicaid . [facility] o Banks, MD; SwazilandJordan, MD; Hassan RowanKoberlein, MD o [address], [place], [place] [address] o [phone] o Mon-Fri 8:00-5:00 o Babies seen by [facility] providers o Does NOT accept Medicaid . [facility] [address] [place] Sohoo Parker, MD; Durene CalHunter, MD; BurlingtonWallace, DO o [address] [place], [place] [address] o [phone] o Mon-Fri 8:00-5:00 o Babies seen by [facility] providers o Does NOT accept Medicaid . [facility] o Tolani LakeBrandon, [place]; [place], [place]; Worthhristy, NP; Avis Epleyees, MD; Vonna KotykeClaire, MD; Clance BolleWeese, MD; Stevphen RochesterHansen, NP; Arvilla MarketMills, NP; Ann MakiParrish, NP; Otis DialsSmoot, NP; Vaughan BastaSummer, MD; MainevilleVapne, MD o [address] [place], [place] [address] o [phone] o Mon-Fri 8:30-5:00, Sat 10:00-1:00 o Providers come to see babies at [facility] o Does NOT accept Medicaid o Free prenatal information session Tuesdays at 4:45pm . [facility] Luna Kitchenso Bouska, MD; AvalonGordon, [place]; River[place]ery, [place]; Weber, [place] o [address] [place]  [address] o [phone] o Mon-Fri 7:30-5:30 o Babies seen by [facility] providers . [facility] o [address], [place], [place]  [address] o [phone]   Fax - [phone]  [address] [place] [phone] & [phone]) . [facility] Alphonsa Overallo Reese, MD o [address] [place], [place] [address] o [phone] o Mon-Thur 8:00-6:00 o Providers come to see babies at [facility] o Accepting Medicaid . [facility] Zenon Mayoo Anderson, NP; Cyndia BentBadger, MD; AllenhurstBeal, [place]; Shannon HillsSpencer, [place] o [address] [place], [place] [address] o [phone] o Mon-Thur 7:30-7:30, Fri 7:30-4:30 o Babies seen by [facility] providers o Accepting Medicaid . [facility] Cheryle Horsfallo Agbuya, MD; Janene HarveyKlett, NP; Vonita Mossomgoolam, MD o [address], [place], [place] [address] o [phone] o Mon-Fri 8:30-5:00, Sat 8:30-12:00 o Providers come to see babies at [facility] o Accepting Medicaid o Must have "Meet & Greet" appointment at office prior to delivery . [facility] [place] [facility]) Llana Alimento McCord, MD; Earlene PlaterWallace, MD; Lucretia RoersWood, MD o [address], [place], [place] [address] o [phone] o Mon-Wed 8:00-6:00, Thur-Fri 8:00-5:00, Sat 9:00-12:00 o Providers come to see babies at [facility] o Does NOT accept Medicaid o Only accepting siblings of current patients . [facility]  o [address],  [place], [place]  [address] o [phone]   Fax - [phone] . [facility] o [phone] [address] [place], [place]  [address] o [phone]   Fax - [phone]  [place] [phone] & [phone]) . Nature conservation officer at [facility] o [place], DO; [place], DO o [address] [place], [place] [address] o [phone] o Mon-Fri 7:00-5:00 o Babies seen by [facility] providers o Does NOT accept  Medicaid . [facility] Ellis Savage, MD; [place], [place]; [place], [place] o [address], [place], [place] [address] o [phone] o Mon-Fri 8:00-5:00 o Babies seen by [facility] providers o Accepting Medicaid . [facility] - [address] Franne Forts, MD; [place], [place]; [place], NP; [place], [place] o [address] [place], [place], [place] [address] o [phone] o Mon-Fri 8:00-5:00 o Babies seen by providers at [facility] o Accepting [facility] [place] [phone]) . [facility] [place], [place] o [address] [place], [place] [address] o [phone] o Mon-Fri 8:00-5:00 o Babies seen by [facility] providers o Does NOT accept Medicaid o Limited availability, please call early in hospitalization to schedule follow-up . [facility] Jolee Ewing, PA; Eddie Candle, MD; [place], MD; [place], [place]; Constance Goltz, MD; [place], [place] o [address] [facility] [address], [place], [place] [address] o [phone] o Mon-Fri 8:30-5:00, Sat 9:00-12:00 o Babies seen by providers at [facility] o Accepting Medicaid o Please register online then schedule online or call office o [email or web address] . [facility]) Samuella Bruin, NP; Lucianne Muss, MD; Lanier Clam, PA o [address], [place], [place] [address] o [phone] o Mon-Fri 8:00-5:00 o Babies seen by providers at [facility] o Accepting Medicaid . [facility] ([facility]) Sharin Mons, MD; Reed Breech, NP; Shelva Majestic, MD o [address] [place] [address], [place], [place] [address] o [phone] o Mon-Fri 8:00-5:30, Sat&Sun by appointment (phones open at 8:30) o Babies seen by [facility] providers o Accepting Medicaid o Must be a first-time baby or sibling of current patient . [facility] [address], [place], [place]  [address] o [phone]   Fax - [phone]  Louisa [phone] & [phone]) . [facility] o [place], [place]; [place], [place]; Dimple Casey, MD; [place], [place]; Carolyne Fiscal, MD o [address] [place], [place] [address] o [phone] o Mon-Thur 8:00-7:00, Fri 8:00-5:00, Sat 8:00-12:00, Sun 9:00-12:00 o Babies seen by [facility] providers o Accepting Medicaid . [facility] at [facility] [address], MD; Gaynell Face, MD; [facility] [place] [facility], MD o [address], [place], [place] [address] o [phone] o Mon-Thur 8:00-5:00 o Babies seen by providers at [facility] o Accepting Medicaid . [facility] Gwenlyn Saran, MD; Coe-Goins, MD; Madilyn Fireman, MD; Melvyn Neth, MD; List, MD; Lazarus Salines, MD; Gaynell Face, MD; Berneda Rose, MD; Flora Lipps, MD; Beryl Meager, MD; Luther Redo, MD; Lavonia Drafts, MD; Kellie Simmering, MD o [address] [place], [place] [address] o [phone] o Mon-Fri 8:00-5:30, Sat (Oct.-Mar.) 9:00-1:00 o Babies seen by providers at [facility] o Accepting Medicaid o Must fill out new patient packet, available online at [email or web address] . [facility] at [facility]) Simone Curia, NP; Tiburcio Pea, NP; Tresa Endo, NP; Whitney Post, MD; [place], [place]; Hennie Duos, MD; Wynne Dust, MD;  Kavin Leech, NP o [address], [place], [place] [address] o [phone] o Mon-Thur 8:00-5:30, Fri 8:00-5:00 o Babies seen by providers at [facility] o Accepting [facility] [phone]) . [facility] o [place], [place]; [place], MD; Tanya Nones, MD; [place], [place] o [address] [place], [place] [address] o [phone] o Mon-Fri 8:00-5:00 o Babies seen by providers at [facility] o Accepting [facility] [phone]) . [facility] at [facility] o [place], DO; Lenise Arena, MD; [place], [place] o [address], [place], [place]  [address] o [phone] o Mon-Fri 8:00-5:00 o Babies seen by providers at [facility] o Does NOT accept Medicaid o Limited appointment availability, please call early in hospitalization  . Nature conservation officer at [facility] o [place], DO; Avery, MD o [address], [place], [place] [address] o [phone] o Mon-Fri 8:00-5:00 o Babies seen by [facility] providers o Does NOT accept Medicaid . [facility] Lorrine Kin, MD; Ninetta Lights, MD; [place], [place]; [place], MD o [address], [place], [place] [address] o [phone] o Mon-Fri 8:00-5:00 o After hours clinic [facility] [place], [place] [address]) [phone] Mon-Fri 5:00-8:00, Sat 12:00-6:00, Sun 10:00-4:00 o Babies seen by [facility] providers o Accepting Medicaid . [facility] at [facility] o [address], [place], [place]  [address] o [phone]   Fax - [phone]  [place] [phone]) . Nature conservation officer at [facility], MD o [address] [place], [place], [place] [address] o [phone] o Mon-Fri 8:00-5:00 o Babies seen by [facility] providers o Does NOT accept Medicaid . [facility] [place] [facility] [place]) Tomi Likens, MD o [address] [place] [address] [place], [place] [address] o [phone] o Mon-Thur 8:00-7:00, Fri 8:00-5:00, Sat 8:00-12:00 o Babies seen by providers at [facility] o Accepting Medicaid - but does not have vaccinations in office (must be received elsewhere) o Limited availability, please call early in hospitalization  [place] ([phone]) . [facility]  o Wyvonne Lenz, MD o [address], [place] [address] o [phone]  Fax [phone]   Contraception Choices Contraception, also called birth control, means things to use or ways to try not to get pregnant. Hormonal birth control This kind of birth control uses hormones. Here are some  types of hormonal birth control:  A tube that is put under skin of the arm (implant). The tube can stay in for as long as 3 years.  Shots to get every 3 months (injections).  Pills to take every day (birth control pills).  A patch to change 1 time each week for 3 weeks (birth control patch). After that, the patch is taken off for 1 week.  A ring to put in the vagina. The ring is left in for 3 weeks. Then it is taken out of the vagina for 1 week. Then a new ring is put in.  Pills to take after unprotected sex (emergency birth control pills). Barrier birth control Here are some types of barrier birth control:  A thin covering that is put on the penis before sex (female condom). The covering is thrown away after sex.  A soft, loose covering that is put in the vagina before sex (female condom). The covering is thrown away after sex.  A rubber bowl that sits over the cervix (diaphragm). The bowl must be made for you. The  bowl is put into the vagina before sex. The bowl is left in for 6-8 hours after sex. It is taken out within 24 hours.  A small, soft cup that fits over the cervix (cervical cap). The cup must be made for you. The cup can be left in for 6-8 hours after sex. It is taken out within 48 hours.  A sponge that is put into the vagina before sex. It must be left in for at least 6 hours after sex. It must be taken out within 30 hours. Then it is thrown away.  A chemical that kills or stops sperm from getting into the uterus (spermicide). It may be a pill, cream, jelly, or foam to put in the vagina. The chemical should be used at least 10-15 minutes before sex. IUD (intrauterine) birth control An IUD is a small, T-shaped piece of plastic. It is put inside the uterus. There are two kinds:  Hormone IUD. This kind can stay in for 3-5 years.  Copper IUD. This kind can stay in for 10 years. Permanent birth control Here are some types of permanent birth control:  Surgery to block the  fallopian tubes.  Having an insert put into each fallopian tube.  Surgery to tie off the tubes that carry sperm (vasectomy). Natural planning birth control Here are some types of natural planning birth control:  Not having sex on the days the woman could get pregnant.  Using a calendar: ? To keep track of the length of each period. ? To find out what days pregnancy can happen. ? To plan to not have sex on days when pregnancy can happen.  Watching for symptoms of ovulation and not having sex during ovulation. One way the woman can check for ovulation is to check her temperature.  Waiting to have sex until after ovulation. Summary  Contraception, also called birth control, means things to use or ways to try not to get pregnant.  Hormonal methods of birth control include implants, injections, pills, patches, vaginal rings, and emergency birth control pills.  Barrier methods of birth control can include female condoms, female condoms, diaphragms, cervical caps, sponges, and spermicides.  There are two types of IUD (intrauterine device) birth control. An IUD can be put in a woman's uterus to prevent pregnancy for 3-5 years.  Permanent sterilization can be done through a procedure for males, females, or both.  Natural planning methods involve not having sex on the days when the woman could get pregnant. This information is not intended to replace advice given to you by your health care provider. Make sure you discuss any questions you have with your health care provider. Document Released: 05/19/2009 Document Revised: 02/26/2018 Document Reviewed: 08/01/2016 Elsevier Interactive Patient Education  2019 [employer].

## 2018-09-19 LAB — CBC
HEMATOCRIT: 31.6 % — AB (ref 34.0–46.6)
HEMOGLOBIN: 10.7 g/dL — AB (ref 11.1–15.9)
MCH: 28.4 pg (ref 26.6–33.0)
MCHC: 33.9 g/dL (ref 31.5–35.7)
MCV: 84 fL (ref 79–97)
Platelets: 223 10*3/uL (ref 150–450)
RBC: 3.77 x10E6/uL (ref 3.77–5.28)
RDW: 13.7 % (ref 11.7–15.4)
WBC: 8 10*3/uL (ref 3.4–10.8)

## 2018-09-19 LAB — COMPREHENSIVE METABOLIC PANEL
ALT: 8 IU/L (ref 0–32)
AST: 14 IU/L (ref 0–40)
Albumin/Globulin Ratio: 1.3 (ref 1.2–2.2)
Albumin: 3.6 g/dL — ABNORMAL LOW (ref 3.9–5.0)
Alkaline Phosphatase: 88 IU/L (ref 39–117)
BUN/Creatinine Ratio: 13 (ref 9–23)
BUN: 9 mg/dL (ref 6–20)
Bilirubin Total: <0.2 mg/dL (ref 0.0–1.2)
CHLORIDE: 106 mmol/L (ref 96–106)
CO2: 18 mmol/L — ABNORMAL LOW (ref 20–29)
Calcium: 9.1 mg/dL (ref 8.7–10.2)
Creatinine, Ser: 0.71 mg/dL (ref 0.57–1.00)
GFR calc Af Amer: 132 mL/min/{1.73_m2} (ref >59)
GFR calc non Af Amer: 115 mL/min/{1.73_m2} (ref >59)
Globulin, Total: 2.7 g/dL (ref 1.5–4.5)
Glucose: 80 mg/dL (ref 65–99)
Potassium: 4.6 mmol/L (ref 3.5–5.2)
Sodium: 140 mmol/L (ref 134–144)
Total Protein: 6.3 g/dL (ref 6.0–8.5)

## 2018-09-19 LAB — PROTEIN / CREATININE RATIO, URINE
Creatinine, Urine: 118.5 mg/dL
Protein, Ur: 14.6 mg/dL
Protein/Creat Ratio: 123 mg/g creat (ref 0–200)

## 2018-09-25 ENCOUNTER — Ambulatory Visit (HOSPITAL_COMMUNITY): Admission: RE | Admit: 2018-09-25 | Payer: PRIVATE HEALTH INSURANCE | Source: Ambulatory Visit

## 2018-09-30 ENCOUNTER — Ambulatory Visit (INDEPENDENT_AMBULATORY_CARE_PROVIDER_SITE_OTHER): Payer: PRIVATE HEALTH INSURANCE | Admitting: Obstetrics and Gynecology

## 2018-09-30 ENCOUNTER — Other Ambulatory Visit: Payer: Self-pay | Admitting: Medical

## 2018-09-30 ENCOUNTER — Other Ambulatory Visit (HOSPITAL_COMMUNITY): Payer: Self-pay | Admitting: *Deleted

## 2018-09-30 ENCOUNTER — Ambulatory Visit (HOSPITAL_COMMUNITY): Payer: PRIVATE HEALTH INSURANCE | Admitting: *Deleted

## 2018-09-30 ENCOUNTER — Ambulatory Visit (HOSPITAL_BASED_OUTPATIENT_CLINIC_OR_DEPARTMENT_OTHER): Payer: PRIVATE HEALTH INSURANCE | Admitting: *Deleted

## 2018-09-30 ENCOUNTER — Encounter (HOSPITAL_COMMUNITY): Payer: Self-pay

## 2018-09-30 ENCOUNTER — Ambulatory Visit (HOSPITAL_COMMUNITY)
Admission: RE | Admit: 2018-09-30 | Discharge: 2018-09-30 | Disposition: A | Discharge status: Home / Self Care | Payer: PRIVATE HEALTH INSURANCE | Source: Ambulatory Visit | Attending: Maternal and Fetal Medicine | Admitting: Maternal and Fetal Medicine

## 2018-09-30 VITALS — BP 127/83 | HR 93 | Wt 263.8 lb

## 2018-09-30 DIAGNOSIS — Z3403 Encounter for supervision of normal first pregnancy, third trimester: Secondary | ICD-10-CM

## 2018-09-30 DIAGNOSIS — Z9289 Personal history of other medical treatment: Secondary | ICD-10-CM | POA: Insufficient documentation

## 2018-09-30 DIAGNOSIS — O139 Gestational [pregnancy-induced] hypertension without significant proteinuria, unspecified trimester: Secondary | ICD-10-CM

## 2018-09-30 DIAGNOSIS — O133 Gestational [pregnancy-induced] hypertension without significant proteinuria, third trimester: Secondary | ICD-10-CM

## 2018-09-30 DIAGNOSIS — Z362 Encounter for other antenatal screening follow-up: Secondary | ICD-10-CM | POA: Diagnosis not present

## 2018-09-30 DIAGNOSIS — Z3400 Encounter for supervision of normal first pregnancy, unspecified trimester: Secondary | ICD-10-CM

## 2018-09-30 DIAGNOSIS — O99019 Anemia complicating pregnancy, unspecified trimester: Secondary | ICD-10-CM | POA: Diagnosis present

## 2018-09-30 DIAGNOSIS — Z3A35 35 weeks gestation of pregnancy: Secondary | ICD-10-CM

## 2018-09-30 NOTE — Progress Notes (Signed)
Subjective:  Christine Mullen is a 33 y.o. G1P0 at [redacted]w[redacted]d being seen today for ongoing prenatal care.  She is currently monitored for the following issues for this high-risk pregnancy and has Supervision of normal first pregnancy, antepartum; Anemia, antepartum; Gestational hypertension w/o significant proteinuria in 3rd trimester; and Obesity in pregnancy, antepartum, third trimester on their problem list.  Patient reports no complaints.  Contractions: Not present. Vag. Bleeding: None.  Movement: Present. Denies leaking of fluid.   The following portions of the patient's history were reviewed and updated as appropriate: allergies, current medications, past family history, past medical history, past social history, past surgical history and problem list. Problem list updated.  Objective:   Vitals:   09/30/18 1540  BP: 127/83  Pulse: 93  Weight: 263 lb 12.8 oz (119.7 kg)    Fetal Status: Fetal Heart Rate (bpm): 142   Movement: Present     General:  Alert, oriented and cooperative. Patient is in no acute distress.  Skin: Skin is warm and dry. No rash noted.   Cardiovascular: Normal heart rate noted  Respiratory: Normal respiratory effort, no problems with respiration noted  Abdomen: Soft, gravid, appropriate for gestational age. Pain/Pressure: Present     Pelvic:  Cervical exam deferred        Extremities: Normal range of motion.  Edema: None  Mental Status: Normal mood and affect. Normal behavior. Normal judgment and thought content.   Urinalysis:      Assessment and Plan:  Pregnancy: G1P0 at [redacted]w[redacted]d  1. Supervision of normal first pregnancy, antepartum Stable GBS/vaginal cultures next visit  2. Gestational hypertension w/o significant proteinuria in 3rd trimester BP stable without meds BPP/NST at MFM today Growth scan today IOL at 37 weeks  Preterm labor symptoms and general obstetric precautions including but not limited to vaginal bleeding, contractions, leaking of fluid and  fetal movement were reviewed in detail with the patient. Please refer to After Visit Summary for other counseling recommendations.  Return in about 1 week (around 10/07/2018) for OB visit.   Hermina Staggers, MD

## 2018-09-30 NOTE — Addendum Note (Signed)
Addended by: Hermina Staggers on: 09/30/2018 04:09 PM   Modules accepted: Orders, SmartSet

## 2018-09-30 NOTE — Patient Instructions (Signed)
Third Trimester of Pregnancy The third trimester is from week 28 through week 40 (months 7 through 9). The third trimester is a time when the unborn baby (fetus) is growing rapidly. At the end of the ninth month, the fetus is about 20 inches in length and weighs 6-10 pounds. Body changes during your third trimester Your body will continue to go through many changes during pregnancy. The changes vary from woman to woman. During the third trimester:  Your weight will continue to increase. You can expect to gain 25-35 pounds (11-16 kg) by the end of the pregnancy.  You may begin to get stretch marks on your hips, abdomen, and breasts.  You may urinate more often because the fetus is moving lower into your pelvis and pressing on your bladder.  You may develop or continue to have heartburn. This is caused by increased hormones that slow down muscles in the digestive tract.  You may develop or continue to have constipation because increased hormones slow digestion and cause the muscles that push waste through your intestines to relax.  You may develop hemorrhoids. These are swollen veins (varicose veins) in the rectum that can itch or be painful.  You may develop swollen, bulging veins (varicose veins) in your legs.  You may have increased body aches in the pelvis, back, or thighs. This is due to weight gain and increased hormones that are relaxing your joints.  You may have changes in your hair. These can include thickening of your hair, rapid growth, and changes in texture. Some women also have hair loss during or after pregnancy, or hair that feels dry or thin. Your hair will most likely return to normal after your baby is born.  Your breasts will continue to grow and they will continue to become tender. A yellow fluid (colostrum) may leak from your breasts. This is the first milk you are producing for your baby.  Your belly button may stick out.  You may notice more swelling in your hands,  face, or ankles.  You may have increased tingling or numbness in your hands, arms, and legs. The skin on your belly may also feel numb.  You may feel short of breath because of your expanding uterus.  You may have more problems sleeping. This can be caused by the size of your belly, increased need to urinate, and an increase in your body's metabolism.  You may notice the fetus "dropping," or moving lower in your abdomen (lightening).  You may have increased vaginal discharge.  You may notice your joints feel loose and you may have pain around your pelvic bone. What to expect at prenatal visits You will have prenatal exams every 2 weeks until week 36. Then you will have weekly prenatal exams. During a routine prenatal visit:  You will be weighed to make sure you and the baby are growing normally.  Your blood pressure will be taken.  Your abdomen will be measured to track your baby's growth.  The fetal heartbeat will be listened to.  Any test results from the previous visit will be discussed.  You may have a cervical check near your due date to see if your cervix has softened or thinned (effaced).  You will be tested for Group B streptococcus. This happens between 35 and 37 weeks. Your health care provider may ask you:  What your birth plan is.  How you are feeling.  If you are feeling the baby move.  If you have had any abnormal   symptoms, such as leaking fluid, bleeding, severe headaches, or abdominal cramping.  If you are using any tobacco products, including cigarettes, chewing tobacco, and electronic cigarettes.  If you have any questions. Other tests or screenings that may be performed during your third trimester include:  Blood tests that check for low iron levels (anemia).  Fetal testing to check the health, activity level, and growth of the fetus. Testing is done if you have certain medical conditions or if there are problems during the pregnancy.  Nonstress test  (NST). This test checks the health of your baby to make sure there are no signs of problems, such as the baby not getting enough oxygen. During this test, a belt is placed around your belly. The baby is made to move, and its heart rate is monitored during movement. What is false labor? False labor is a condition in which you feel small, irregular tightenings of the muscles in the womb (contractions) that usually go away with rest, changing position, or drinking water. These are called Braxton Hicks contractions. Contractions may last for hours, days, or even weeks before true labor sets in. If contractions come at regular intervals, become more frequent, increase in intensity, or become painful, you should see your health care provider. What are the signs of labor?  Abdominal cramps.  Regular contractions that start at 10 minutes apart and become stronger and more frequent with time.  Contractions that start on the top of the uterus and spread down to the lower abdomen and back.  Increased pelvic pressure and dull back pain.  A watery or bloody mucus discharge that comes from the vagina.  Leaking of amniotic fluid. This is also known as your "water breaking." It could be a slow trickle or a gush. Let your health care provider know if it has a color or strange odor. If you have any of these signs, call your health care provider right away, even if it is before your due date. Follow these instructions at home: Medicines  Follow your health care provider's instructions regarding medicine use. Specific medicines may be either safe or unsafe to take during pregnancy.  Take a prenatal vitamin that contains at least 600 micrograms (mcg) of folic acid.  If you develop constipation, try taking a stool softener if your health care provider approves. Eating and drinking   Eat a balanced diet that includes fresh fruits and vegetables, whole grains, good sources of protein such as meat, eggs, or tofu,  and low-fat dairy. Your health care provider will help you determine the amount of weight gain that is right for you.  Avoid raw meat and uncooked cheese. These carry germs that can cause birth defects in the baby.  If you have low calcium intake from food, talk to your health care provider about whether you should take a daily calcium supplement.  Eat four or five small meals rather than three large meals a day.  Limit foods that are high in fat and processed sugars, such as fried and sweet foods.  To prevent constipation: ? Drink enough fluid to keep your urine clear or pale yellow. ? Eat foods that are high in fiber, such as fresh fruits and vegetables, whole grains, and beans. Activity  Exercise only as directed by your health care provider. Most women can continue their usual exercise routine during pregnancy. Try to exercise for 30 minutes at least 5 days a week. Stop exercising if you experience uterine contractions.  Avoid heavy lifting.  Do   not exercise in extreme heat or humidity, or at high altitudes.  Wear low-heel, comfortable shoes.  Practice good posture.  You may continue to have sex unless your health care provider tells you otherwise. Relieving pain and discomfort  Take frequent breaks and rest with your legs elevated if you have leg cramps or low back pain.  Take warm sitz baths to soothe any pain or discomfort caused by hemorrhoids. Use hemorrhoid cream if your health care provider approves.  Wear a good support bra to prevent discomfort from breast tenderness.  If you develop varicose veins: ? Wear support pantyhose or compression stockings as told by your healthcare provider. ? Elevate your feet for 15 minutes, 3-4 times a day. Prenatal care  Write down your questions. Take them to your prenatal visits.  Keep all your prenatal visits as told by your health care provider. This is important. Safety  Wear your seat belt at all times when driving.  Make  a list of emergency phone numbers, including numbers for family, friends, the hospital, and police and fire departments. General instructions  Avoid cat litter boxes and soil used by cats. These carry germs that can cause birth defects in the baby. If you have a cat, ask someone to clean the litter box for you.  Do not travel far distances unless it is absolutely necessary and only with the approval of your health care provider.  Do not use hot tubs, steam rooms, or saunas.  Do not drink alcohol.  Do not use any products that contain nicotine or tobacco, such as cigarettes and e-cigarettes. If you need help quitting, ask your health care provider.  Do not use any medicinal herbs or unprescribed drugs. These chemicals affect the formation and growth of the baby.  Do not douche or use tampons or scented sanitary pads.  Do not cross your legs for long periods of time.  To prepare for the arrival of your baby: ? Take prenatal classes to understand, practice, and ask questions about labor and delivery. ? Make a trial run to the hospital. ? Visit the hospital and tour the maternity area. ? Arrange for maternity or paternity leave through employers. ? Arrange for family and friends to take care of pets while you are in the hospital. ? Purchase a rear-facing car seat and make sure you know how to install it in your car. ? Pack your hospital bag. ? Prepare the baby's nursery. Make sure to remove all pillows and stuffed animals from the baby's crib to prevent suffocation.  Visit your dentist if you have not gone during your pregnancy. Use a soft toothbrush to brush your teeth and be gentle when you floss. Contact a health care provider if:  You are unsure if you are in labor or if your water has broken.  You become dizzy.  You have mild pelvic cramps, pelvic pressure, or nagging pain in your abdominal area.  You have lower back pain.  You have persistent nausea, vomiting, or  diarrhea.  You have an unusual or bad smelling vaginal discharge.  You have pain when you urinate. Get help right away if:  Your water breaks before 37 weeks.  You have regular contractions less than 5 minutes apart before 37 weeks.  You have a fever.  You are leaking fluid from your vagina.  You have spotting or bleeding from your vagina.  You have severe abdominal pain or cramping.  You have rapid weight loss or weight gain.  You have   shortness of breath with chest pain.  You notice sudden or extreme swelling of your face, hands, ankles, feet, or legs.  Your baby makes fewer than 10 movements in 2 hours.  You have severe headaches that do not go away when you take medicine.  You have vision changes. Summary  The third trimester is from week 28 through week 40, months 7 through 9. The third trimester is a time when the unborn baby (fetus) is growing rapidly.  During the third trimester, your discomfort may increase as you and your baby continue to gain weight. You may have abdominal, leg, and back pain, sleeping problems, and an increased need to urinate.  During the third trimester your breasts will keep growing and they will continue to become tender. A yellow fluid (colostrum) may leak from your breasts. This is the first milk you are producing for your baby.  False labor is a condition in which you feel small, irregular tightenings of the muscles in the womb (contractions) that eventually go away. These are called Braxton Hicks contractions. Contractions may last for hours, days, or even weeks before true labor sets in.  Signs of labor can include: abdominal cramps; regular contractions that start at 10 minutes apart and become stronger and more frequent with time; watery or bloody mucus discharge that comes from the vagina; increased pelvic pressure and dull back pain; and leaking of amniotic fluid. This information is not intended to replace advice given to you by your  health care provider. Make sure you discuss any questions you have with your health care provider. Document Released: 07/16/2001 Document Revised: 08/27/2016 Document Reviewed: 08/27/2016 Elsevier Interactive Patient Education  2019 [employer]  

## 2018-10-07 ENCOUNTER — Ambulatory Visit (HOSPITAL_BASED_OUTPATIENT_CLINIC_OR_DEPARTMENT_OTHER): Payer: PRIVATE HEALTH INSURANCE | Admitting: *Deleted

## 2018-10-07 ENCOUNTER — Encounter: Payer: Self-pay | Admitting: Obstetrics and Gynecology

## 2018-10-07 ENCOUNTER — Encounter (HOSPITAL_COMMUNITY): Payer: Self-pay | Admitting: *Deleted

## 2018-10-07 ENCOUNTER — Ambulatory Visit (INDEPENDENT_AMBULATORY_CARE_PROVIDER_SITE_OTHER): Payer: PRIVATE HEALTH INSURANCE | Admitting: Obstetrics and Gynecology

## 2018-10-07 ENCOUNTER — Other Ambulatory Visit (HOSPITAL_COMMUNITY): Payer: Self-pay | Admitting: Obstetrics and Gynecology

## 2018-10-07 ENCOUNTER — Ambulatory Visit (HOSPITAL_COMMUNITY)
Admission: RE | Admit: 2018-10-07 | Discharge: 2018-10-07 | Disposition: A | Discharge status: Home / Self Care | Payer: PRIVATE HEALTH INSURANCE | Source: Ambulatory Visit | Attending: Obstetrics and Gynecology | Admitting: Obstetrics and Gynecology

## 2018-10-07 ENCOUNTER — Ambulatory Visit (HOSPITAL_COMMUNITY): Payer: PRIVATE HEALTH INSURANCE | Admitting: *Deleted

## 2018-10-07 VITALS — BP 149/91 | HR 67 | Wt 261.3 lb

## 2018-10-07 DIAGNOSIS — O99213 Obesity complicating pregnancy, third trimester: Secondary | ICD-10-CM | POA: Diagnosis not present

## 2018-10-07 DIAGNOSIS — O99013 Anemia complicating pregnancy, third trimester: Secondary | ICD-10-CM

## 2018-10-07 DIAGNOSIS — Z113 Encounter for screening for infections with a predominantly sexual mode of transmission: Secondary | ICD-10-CM | POA: Diagnosis not present

## 2018-10-07 DIAGNOSIS — O99333 Smoking (tobacco) complicating pregnancy, third trimester: Secondary | ICD-10-CM

## 2018-10-07 DIAGNOSIS — O365930 Maternal care for other known or suspected poor fetal growth, third trimester, not applicable or unspecified: Secondary | ICD-10-CM

## 2018-10-07 DIAGNOSIS — O139 Gestational [pregnancy-induced] hypertension without significant proteinuria, unspecified trimester: Secondary | ICD-10-CM

## 2018-10-07 DIAGNOSIS — Z3A36 36 weeks gestation of pregnancy: Secondary | ICD-10-CM | POA: Diagnosis not present

## 2018-10-07 DIAGNOSIS — O133 Gestational [pregnancy-induced] hypertension without significant proteinuria, third trimester: Secondary | ICD-10-CM | POA: Diagnosis not present

## 2018-10-07 DIAGNOSIS — O26843 Uterine size-date discrepancy, third trimester: Secondary | ICD-10-CM | POA: Diagnosis not present

## 2018-10-07 DIAGNOSIS — Z3400 Encounter for supervision of normal first pregnancy, unspecified trimester: Secondary | ICD-10-CM

## 2018-10-07 DIAGNOSIS — O99019 Anemia complicating pregnancy, unspecified trimester: Secondary | ICD-10-CM | POA: Insufficient documentation

## 2018-10-07 NOTE — Patient Instructions (Signed)

## 2018-10-07 NOTE — Progress Notes (Signed)
Pt presents for ROB/GC/CT. IOL scheduled for March 11th

## 2018-10-07 NOTE — Progress Notes (Signed)
Subjective:  Christine Mullen is a 33 y.o. G1P0 at [redacted]w[redacted]d being seen today for ongoing prenatal care.  She is currently monitored for the following issues for this high-risk pregnancy and has Supervision of normal first pregnancy, antepartum; Anemia, antepartum; Gestational hypertension w/o significant proteinuria in 3rd trimester; and Obesity in pregnancy, antepartum, third trimester on their problem list.  Patient reports general discomforts of pregnancy. Denies HA, epigastric pain or visual changes.  Contractions: Irritability. Vag. Bleeding: None.  Movement: Present. Denies leaking of fluid.   The following portions of the patient's history were reviewed and updated as appropriate: allergies, current medications, past family history, past medical history, past social history, past surgical history and problem list. Problem list updated.  Objective:   Vitals:   10/07/18 1430 10/07/18 1436  BP: (!) 146/85 (!) 149/91  Pulse: 67   Weight: 261 lb 4.8 oz (118.5 kg)     Fetal Status: Fetal Heart Rate (bpm): 146   Movement: Present     General:  Alert, oriented and cooperative. Patient is in no acute distress.  Skin: Skin is warm and dry. No rash noted.   Cardiovascular: Normal heart rate noted  Respiratory: Normal respiratory effort, no problems with respiration noted  Abdomen: Soft, gravid, appropriate for gestational age. Pain/Pressure: Absent     Pelvic:  Cervical exam performed        Extremities: Normal range of motion.  Edema: Trace  Mental Status: Normal mood and affect. Normal behavior. Normal judgment and thought content.   Urinalysis:      Assessment and Plan:  Pregnancy: G1P0 at [redacted]w[redacted]d  1. Supervision of normal first pregnancy, antepartum Labor precautions - Culture, beta strep (group b only) - Cervicovaginal ancillary only( [place])  2. Gestational hypertension w/o significant proteinuria in 3rd trimester BP stable without meds BPP 10/10 today IOL next  Wednesday  Preterm labor symptoms and general obstetric precautions including but not limited to vaginal bleeding, contractions, leaking of fluid and fetal movement were reviewed in detail with the patient. Please refer to After Visit Summary for other counseling recommendations.  No follow-ups on file.   Hermina Staggers, MD

## 2018-10-07 NOTE — Procedures (Signed)
Christine Mullen 06/29/1986 [redacted]w[redacted]d  Fetus A Non-Stress Test Interpretation for 10/07/18  Indication: SGA  Fetal Heart Rate A Mode: External Baseline Rate (A): 140 bpm Variability: Moderate Accelerations: 15 x 15 Decelerations: None Multiple birth?: No  Uterine Activity Mode: Toco Contraction Frequency (min): none noted Contraction Quality: Mild Resting Tone Palpated: Relaxed Resting Time: Adequate  Interpretation (Fetal Testing) Nonstress Test Interpretation: Reactive Comments: FHR tracing rev'd by Dr. Judeth Cornfield

## 2018-10-08 LAB — CERVICOVAGINAL ANCILLARY ONLY
Chlamydia: NEGATIVE
Neisseria Gonorrhea: NEGATIVE

## 2018-10-09 ENCOUNTER — Other Ambulatory Visit: Payer: Self-pay | Admitting: Advanced Practice Midwife

## 2018-10-11 LAB — CULTURE, BETA STREP (GROUP B ONLY): Strep Gp B Culture: NEGATIVE

## 2018-10-12 ENCOUNTER — Encounter (HOSPITAL_COMMUNITY): Payer: Self-pay | Admitting: *Deleted

## 2018-10-12 ENCOUNTER — Telehealth (HOSPITAL_COMMUNITY): Payer: Self-pay | Admitting: *Deleted

## 2018-10-12 DIAGNOSIS — Z3483 Encounter for supervision of other normal pregnancy, third trimester: Secondary | ICD-10-CM

## 2018-10-12 NOTE — Telephone Encounter (Signed)
Preadmission screen  

## 2018-10-13 ENCOUNTER — Other Ambulatory Visit (HOSPITAL_COMMUNITY): Payer: Self-pay | Admitting: *Deleted

## 2018-10-14 ENCOUNTER — Inpatient Hospital Stay (HOSPITAL_COMMUNITY): Payer: PRIVATE HEALTH INSURANCE

## 2018-10-14 ENCOUNTER — Encounter (HOSPITAL_COMMUNITY): Payer: Self-pay | Admitting: *Deleted

## 2018-10-14 ENCOUNTER — Inpatient Hospital Stay (HOSPITAL_COMMUNITY)
Admission: AD | Admit: 2018-10-14 | Discharge: 2018-10-17 | DRG: 807 | Disposition: A | Discharge status: Home / Self Care | Payer: PRIVATE HEALTH INSURANCE | Attending: Obstetrics and Gynecology | Admitting: Obstetrics and Gynecology

## 2018-10-14 DIAGNOSIS — D649 Anemia, unspecified: Secondary | ICD-10-CM | POA: Diagnosis present

## 2018-10-14 DIAGNOSIS — Z3A37 37 weeks gestation of pregnancy: Secondary | ICD-10-CM | POA: Diagnosis not present

## 2018-10-14 DIAGNOSIS — Z87891 Personal history of nicotine dependence: Secondary | ICD-10-CM

## 2018-10-14 DIAGNOSIS — E669 Obesity, unspecified: Secondary | ICD-10-CM | POA: Diagnosis present

## 2018-10-14 DIAGNOSIS — O139 Gestational [pregnancy-induced] hypertension without significant proteinuria, unspecified trimester: Secondary | ICD-10-CM | POA: Diagnosis present

## 2018-10-14 DIAGNOSIS — O9902 Anemia complicating childbirth: Secondary | ICD-10-CM | POA: Diagnosis present

## 2018-10-14 DIAGNOSIS — O99019 Anemia complicating pregnancy, unspecified trimester: Secondary | ICD-10-CM

## 2018-10-14 DIAGNOSIS — Z3400 Encounter for supervision of normal first pregnancy, unspecified trimester: Secondary | ICD-10-CM

## 2018-10-14 DIAGNOSIS — O134 Gestational [pregnancy-induced] hypertension without significant proteinuria, complicating childbirth: Principal | ICD-10-CM | POA: Diagnosis present

## 2018-10-14 DIAGNOSIS — O99214 Obesity complicating childbirth: Secondary | ICD-10-CM | POA: Diagnosis present

## 2018-10-14 HISTORY — DX: Anemia, unspecified: D64.9

## 2018-10-14 LAB — COMPREHENSIVE METABOLIC PANEL
ALT: 14 U/L (ref 0–44)
AST: 15 U/L (ref 15–41)
Albumin: 2.6 g/dL — ABNORMAL LOW (ref 3.5–5.0)
Alkaline Phosphatase: 93 U/L (ref 38–126)
Anion gap: 5 (ref 5–15)
BUN: 7 mg/dL (ref 6–20)
CO2: 21 mmol/L — ABNORMAL LOW (ref 22–32)
Calcium: 8.7 mg/dL — ABNORMAL LOW (ref 8.9–10.3)
Chloride: 109 mmol/L (ref 98–111)
Creatinine, Ser: 0.63 mg/dL (ref 0.44–1.00)
GFR calc Af Amer: >60 mL/min (ref >60)
GFR calc non Af Amer: >60 mL/min (ref >60)
Glucose, Bld: 81 mg/dL (ref 70–99)
POTASSIUM: 3.5 mmol/L (ref 3.5–5.1)
Sodium: 135 mmol/L (ref 135–145)
Total Bilirubin: 0.4 mg/dL (ref 0.3–1.2)
Total Protein: 6 g/dL — ABNORMAL LOW (ref 6.5–8.1)

## 2018-10-14 LAB — CBC
HEMATOCRIT: 36.1 % (ref 36.0–46.0)
Hemoglobin: 11.1 g/dL — ABNORMAL LOW (ref 12.0–15.0)
MCH: 26.4 pg (ref 26.0–34.0)
MCHC: 30.7 g/dL (ref 30.0–36.0)
MCV: 86 fL (ref 80.0–100.0)
Platelets: 173 10*3/uL (ref 150–400)
RBC: 4.2 MIL/uL (ref 3.87–5.11)
RDW: 14.2 % (ref 11.5–15.5)
WBC: 8.1 10*3/uL (ref 4.0–10.5)
nRBC: 0 % (ref 0.0–0.2)

## 2018-10-14 LAB — TYPE AND SCREEN
ABO/RH(D): O POS
ANTIBODY SCREEN: NEGATIVE

## 2018-10-14 LAB — ABO/RH: ABO/RH(D): O POS

## 2018-10-14 LAB — RPR: RPR Ser Ql: NONREACTIVE

## 2018-10-14 MED ORDER — TERBUTALINE SULFATE 1 MG/ML IJ SOLN: 0.2500 mg | Freq: Once | INTRAMUSCULAR | Status: DC | PRN

## 2018-10-14 MED ORDER — LACTATED RINGERS IV SOLN: 500.0000 mL | INTRAVENOUS | Status: DC | PRN

## 2018-10-14 MED ORDER — MISOPROSTOL 50MCG HALF TABLET
50.0000 ug | ORAL_TABLET | ORAL | Status: DC
  Administered 2018-10-14 (×3): 50 ug via ORAL
  Filled 2018-10-14 (×3): qty 1

## 2018-10-14 MED ORDER — OXYTOCIN BOLUS FROM INFUSION
500.0000 mL | Freq: Once | INTRAVENOUS | Status: AC
  Administered 2018-10-15: 500 mL via INTRAVENOUS

## 2018-10-14 MED ORDER — LACTATED RINGERS IV SOLN
INTRAVENOUS | Status: DC
  Administered 2018-10-14 (×3): via INTRAVENOUS

## 2018-10-14 MED ORDER — OXYCODONE-ACETAMINOPHEN 5-325 MG PO TABS: 1.0000 | ORAL_TABLET | ORAL | Status: DC | PRN

## 2018-10-14 MED ORDER — OXYCODONE-ACETAMINOPHEN 5-325 MG PO TABS: 2.0000 | ORAL_TABLET | ORAL | Status: DC | PRN

## 2018-10-14 MED ORDER — SOD CITRATE-CITRIC ACID 500-334 MG/5ML PO SOLN: 30.0000 mL | ORAL | Status: DC | PRN

## 2018-10-14 MED ORDER — LIDOCAINE HCL (PF) 1 % IJ SOLN
30.0000 mL | INTRAMUSCULAR | Status: DC | PRN
  Filled 2018-10-14: qty 30

## 2018-10-14 MED ORDER — ONDANSETRON HCL 4 MG/2ML IJ SOLN: 4.0000 mg | Freq: Four times a day (QID) | INTRAMUSCULAR | Status: DC | PRN

## 2018-10-14 MED ORDER — ACETAMINOPHEN 325 MG PO TABS: 650.0000 mg | ORAL_TABLET | ORAL | Status: DC | PRN

## 2018-10-14 MED ORDER — FENTANYL CITRATE (PF) 100 MCG/2ML IJ SOLN
100.0000 ug | INTRAMUSCULAR | Status: DC | PRN
  Administered 2018-10-14 – 2018-10-15 (×2): 100 ug via INTRAVENOUS
  Filled 2018-10-14 (×2): qty 2

## 2018-10-14 MED ORDER — OXYTOCIN 40 UNITS IN NORMAL SALINE INFUSION - SIMPLE MED
1.0000 m[IU]/min | INTRAVENOUS | Status: DC
  Administered 2018-10-15: 2 m[IU]/min via INTRAVENOUS

## 2018-10-14 MED ORDER — OXYTOCIN 40 UNITS IN NORMAL SALINE INFUSION - SIMPLE MED
2.5000 [IU]/h | INTRAVENOUS | Status: DC
  Filled 2018-10-14: qty 1000

## 2018-10-14 NOTE — Progress Notes (Signed)
Labor Progress Note Christine Mullen is a 33 y.o. G1P0 at [redacted]w[redacted]d  admitted for IOL for gHTN.  S:  On introduction to patient she said that she is not having severe pain, She currently has FB in and said that she is having +FM, she denies HA, RUQ pain, Visual Symptoms.   O:  BP (!) 154/64   Pulse 68   Temp 97.9 F (36.6 C) (Oral)   Resp 18   Ht 5\' 6"  (1.676 m)   Wt 118.8 kg   LMP 01/28/2018   BMI 42.29 kg/m  EFM: baseline 140 bpm/ >6 variability/ + accels/ - decels  Toco: every 5 mins  SVE: Dilation: 3 Effacement (%): 50 Cervical Position: Posterior Station: -1 Presentation: Vertex(confirmed by bedside Ultrasound by Dr.Mullis) Exam by:: Dr. Mauri Reading Pitocin: _ mu/min Cytotec: X3   A/P: Christine Mullen is a 33 y.o. G1P0 at [redacted]w[redacted]d  admitted for IOL for gHTN. 1. Labor: Latent  2. FWB: Cat 1  3. Pain: Mild   -Anticipate NSVD after mechanical dilation and cervical ripening with cytotec  -Epidural on request. -Waiting for FB to fall out   Sandi Raveling, MD PGY 1 [facility]  9:01 PM

## 2018-10-14 NOTE — Progress Notes (Signed)
LABOR PROGRESS NOTE  Lilian Smyer is a 33 y.o. G1P0 at [redacted]w[redacted]d  admitted for IOL for gHTN.  Subjective: Feeling well. No complaints or concerns.  Objective: BP (!) 142/85   Pulse (!) 58   Temp 98.5 F (36.9 C) (Oral)   Resp 18   Ht 5\' 6"  (1.676 m)   Wt 118.8 kg   LMP 01/28/2018   BMI 42.29 kg/m  or  Vitals:   10/14/18 0818 10/14/18 1223 10/14/18 1419 10/14/18 1422  BP: (!) 142/79 (!) 145/88 (!) 142/85 (!) 142/85  Pulse: 77 78 (!) 58 (!) 58  Resp: 18 18    Temp: 98.5 F (36.9 C)     TempSrc: Oral     Weight:      Height:        Dilation: 3 Effacement (%): 50 Cervical Position: Posterior Station: -1 Presentation: Vertex Exam by:: Dr. Mauri Reading FHT: baseline rate 135 bpm, moderate varibility, + acel, no decel Toco: irregular, ctx q4-76mins  Labs: Lab Results  Component Value Date   WBC 8.1 10/14/2018   HGB 11.1 (L) 10/14/2018   HCT 36.1 10/14/2018   MCV 86.0 10/14/2018   PLT 173 10/14/2018    Patient Active Problem List   Diagnosis Date Noted  . Gestational hypertension 10/14/2018  . Gestational hypertension w/o significant proteinuria in 3rd trimester 09/18/2018  . Obesity in pregnancy, antepartum, third trimester 09/18/2018  . Anemia, antepartum 08/13/2018  . Supervision of normal first pregnancy, antepartum 04/16/2018    Assessment / Plan: 33 y.o. G1P0 at [redacted]w[redacted]d here for IOL for gHTN  Labor: Minimal progression s/p Cytotec x1. FB placed and cytotec x 2 given.  Fetal Wellbeing:  Cat I Pain Control:  IV pain meds/Epidural upon request Anticipated MOD:  NSVD  Orpah Cobb, D.O. [facility], PGY1 10/14/2018, 4:33 PM

## 2018-10-14 NOTE — H&P (Addendum)
OBSTETRIC ADMISSION HISTORY AND PHYSICAL  Christine Mullen is a 33 y.o. female G1P0 with IUP at [redacted]w[redacted]d by [place] MFM (06/10/2018) presenting for IOL for gestational HTN. She reports +FMs, No LOF, no VB, no blurry vision, headaches or peripheral edema, and RUQ pain.  She plans on bottle feeding. She requests OCPs for birth control. She received her prenatal care at [facility]   Dating: By LMP (01/28/18) --->  Estimated Date of Delivery: 11/04/18  Sono:  (10/07/2018)  @[redacted]w[redacted]d , CWD, normal anatomy, uterine size-date discrepancy, third trimester; cephalic presentation  Prenatal History/Complications:  Past Medical History: Past Medical History:  Diagnosis Date  . Anemia   . Medical history non-contributory   . Pregnancy induced hypertension     Past Surgical History: Past Surgical History:  Procedure Laterality Date  . NO PAST SURGERIES      Obstetrical History: OB History    Gravida  1   Para      Term      Preterm      AB      Living  0     SAB      TAB      Ectopic      Multiple      Live Births              Social History: Social History   Socioeconomic History  . Marital status: Single    Spouse name: Not on file  . Number of children: Not on file  . Years of education: Not on file  . Highest education level: Not on file  Occupational History  . Not on file  Social Needs  . Financial resource strain: Not hard at all  . Food insecurity:    Worry: Never true    Inability: Never true  . Transportation needs:    Medical: No    Non-medical: Not on file  Tobacco Use  . Smoking status: Former Smoker    Types: Cigarettes    Last attempt to quit: 06/13/2018    Years since quitting: 0.3  . Smokeless tobacco: Never Used  Substance and Sexual Activity  . Alcohol use: Not Currently  . Drug use: No  . Sexual activity: Yes    Birth control/protection: None  Lifestyle  . Physical activity:    Days per week: Not on file    Minutes per session: Not on file  .  Stress: Only a little  Relationships  . Social connections:    Talks on phone: Not on file    Gets together: Not on file    Attends religious service: Not on file    Active member of club or organization: Not on file    Attends meetings of clubs or organizations: Not on file    Relationship status: Not on file  Other Topics Concern  . Not on file  Social History Narrative  . Not on file    Family History: Family History  Problem Relation Age of Onset  . Hypertension Mother   . Mental illness Mother   . Hypertension Father     Allergies: No Known Allergies  Medications Prior to Admission  Medication Sig Dispense Refill Last Dose  . ferrous sulfate 325 (65 FE) MG tablet Take 1 tablet (325 mg total) by mouth 2 (two) times daily with a meal. 60 tablet 2 Taking  . Prenatal Vit-Fe Fumarate-FA (MULTIVITAMIN-PRENATAL) 27-0.8 MG TABS tablet Take 1 tablet by mouth daily at 12 noon. 30 each 11 Taking  Review of Systems   All systems reviewed and negative except as stated in HPI  Last menstrual period 01/28/2018. General appearance: alert, cooperative and no distress Lungs: clear to auscultation bilaterally Heart: regular rate and rhythm Abdomen: soft, non-tender; bowel sounds normal Pelvic: Dilation: 3 Effacement (%): Thick Cervical Position: Posterior Station: -1 Presentation: Vertex Exam by:: mullis Extremities: Homans sign is negative, no sign of DVT Presentation: cephalic ([place] on 10/07/2018) Fetal monitoringBaseline: 140 bpm, Variability: Good {> 6 bpm), Accelerations: Reactive and Decelerations: Absent Uterine activity: None   Prenatal labs: ABO, Rh: O/Positive/-- (09/12 1512) Antibody: Negative (09/12 1512) Rubella: 2.79 (09/12 1512) RPR: Non Reactive (01/08 1022)  HBsAg: Negative (09/12 1512)  HIV: Non Reactive (01/08 1022)  GBS: Negative 2 hours with 1hr Glucose: Fasting (88), 1 Hour (134), 2 hour (102) Genetic screening Normal Anatomy US; Normal;  uterine-size date discrepancy (10/07/2018)   Prenatal Transfer Tool  Maternal Diabetes: No Genetic Screening: Normal Maternal Ultrasounds/Referrals: Normal Fetal Ultrasounds or other Referrals:  None Maternal Substance Abuse:  None Significant Maternal Medications:  None Significant Maternal Lab Results: None  No results found for this or any previous visit (from the past 24 hour(s)).  Patient Active Problem List   Diagnosis Date Noted  . Gestational hypertension 10/14/2018  . Gestational hypertension w/o significant proteinuria in 3rd trimester 09/18/2018  . Obesity in pregnancy, antepartum, third trimester 09/18/2018  . Anemia, antepartum 08/13/2018  . Supervision of normal first pregnancy, antepartum 04/16/2018    Assessment/Plan:  Christine Mullen is a 33 y.o. G1P0 at [redacted]w[redacted]d here for IOL for gestational HTN.   #Labor: Labor progressing well. Cytotec (oral) Q4h.  #Pain: Epidural #FWB: Cat 1 #ID: GBS negative (10/07/2018) #MOF: Bottle feeding #MOC: OCPs at pp visit  Johney Maine, Student-PA  10/14/2018, 8:01 AM   OB FELLOW HISTORY AND PHYSICAL ATTESTATION  I have seen and examined this patient; I agree with above documentation in the resident's note.   Gwenevere Abbot, MD  OB Fellow  10/14/2018, 6:26 PM

## 2018-10-14 NOTE — Progress Notes (Signed)
LABOR PROGRESS NOTE  Christine Mullen is a 33 y.o. G1P0 at [redacted]w[redacted]d  admitted for IOL for gHTN.  Subjective: Patient comfortable in bed with SO at bedside. Feeling contractions but tolerable and improved with IV pain meds. No concerns or complaints.  Objective: BP (!) 154/64   Pulse 68   Temp 97.9 F (36.6 C) (Oral)   Resp 18   Ht 5\' 6"  (1.676 m)   Wt 118.8 kg   LMP 01/28/2018   BMI 42.29 kg/m  or  Vitals:   10/14/18 1800 10/14/18 1849 10/14/18 1850 10/14/18 1951  BP: 134/63 (!) 141/62  (!) 154/64  Pulse: (!) 53 62  68  Resp: 18 16 18 18   Temp:  97.9 F (36.6 C)    TempSrc:  Oral    Weight:      Height:       Dilation: 3 Effacement (%): 50 Cervical Position: Posterior Station: -1 Presentation: Vertex(confirmed by bedside Ultrasound by Dr.Chayson Charters) Exam by:: Dr. Mauri Reading FHT: baseline rate 135 bpm, moderate varibility, + acel, no decel Toco: minimal  Labs: Lab Results  Component Value Date   WBC 8.1 10/14/2018   HGB 11.1 (L) 10/14/2018   HCT 36.1 10/14/2018   MCV 86.0 10/14/2018   PLT 173 10/14/2018    Patient Active Problem List   Diagnosis Date Noted  . Gestational hypertension 10/14/2018  . Gestational hypertension w/o significant proteinuria in 3rd trimester 09/18/2018  . Obesity in pregnancy, antepartum, third trimester 09/18/2018  . Anemia, antepartum 08/13/2018  . Supervision of normal first pregnancy, antepartum 04/16/2018    Assessment / Plan: 33 y.o. G1P0 at [redacted]w[redacted]d here for IOL for gHTN.  Labor: Minimal contractions. Foley bulb still in place. Vertex confirmed with u/s. Will give another cytotec.  Fetal Wellbeing:  Cat I Pain Control:  IV pain meds, epidural upon request Anticipated MOD:  NSVD  Orpah Cobb, D.O. [facility], PGY31 10/14/2018, 7:59 PM

## 2018-10-14 NOTE — Progress Notes (Signed)
Labor Progress Note Christine Mullen is a 33 y.o. G1P0 at [redacted]w[redacted]d  admitted for IOL for gHTN.  S:  Comfortable and FB is out. that she is having +FM, she denies HA, RUQ pain, Visual Symptoms.   O:  BP 136/70   Pulse (!) 56   Temp 98.8 F (37.1 C) (Oral)   Resp 17   Ht 5\' 6"  (1.676 m)   Wt 118.8 kg   LMP 01/28/2018   BMI 42.29 kg/m  EFM: baseline 130 bpm/ >6 variability/ + accels/ - decels  Toco: every 2-7 mins  SVE: Dilation: 5 Effacement (%): 90 Cervical Position: Posterior Station: -1 Presentation: Vertex Exam by:: Dr.Eris Breck  Pitocin: 2 mu/min Cytotec: s/p X3    A/P: Christine Mullen is a 33 y.o. G1P0 at [redacted]w[redacted]d  admitted for IOL for gHTN. 1. Labor: Latent  2. FWB: Cat 1  3. Pain: Mild   -Anticipate NSVD FB out at 2345 hrs, Membranes intact and bulging, fetal head not well applied will start pitocin and reassess for possible AROM at 0400. -Pitocin to start  -Epidural on request.   Sandi Raveling, MD PGY 1 [facility]  11:55 PM

## 2018-10-14 NOTE — Anesthesia Pain Management Evaluation Note (Signed)
  [facility] Pain Management Visit Note  Patient: Christine Mullen, 33 y.o., female  "Hello I am a member of the anesthesia team at [facility]. We have an anesthesia team available at all times to provide care throughout the hospital, including epidural management and anesthesia for C-section. I don't know your plan for the delivery whether it a natural birth, water birth, IV sedation, nitrous supplementation, doula or epidural, but we want to meet your pain goals."   1.Was your pain managed to your expectations on prior hospitalizations?   No prior hospitalizations  2.What is your expectation for pain management during this hospitalization?     Epidural  3.How can we help you reach that goal?   Record the patient's initial score and the patient's pain goal.   Pain: 0  Pain Goal: 8 [facility] wants you to be able to say your pain was always managed very well.  Laban Emperor 10/14/2018

## 2018-10-15 ENCOUNTER — Encounter (HOSPITAL_COMMUNITY): Payer: Self-pay

## 2018-10-15 DIAGNOSIS — O134 Gestational [pregnancy-induced] hypertension without significant proteinuria, complicating childbirth: Secondary | ICD-10-CM

## 2018-10-15 DIAGNOSIS — Z3A37 37 weeks gestation of pregnancy: Secondary | ICD-10-CM

## 2018-10-15 LAB — COMPREHENSIVE METABOLIC PANEL
ALBUMIN: 2.6 g/dL — AB (ref 3.5–5.0)
ALT: 15 U/L (ref 0–44)
AST: 18 U/L (ref 15–41)
Alkaline Phosphatase: 93 U/L (ref 38–126)
Anion gap: 8 (ref 5–15)
BUN: 6 mg/dL (ref 6–20)
CO2: 19 mmol/L — ABNORMAL LOW (ref 22–32)
Calcium: 8.4 mg/dL — ABNORMAL LOW (ref 8.9–10.3)
Chloride: 107 mmol/L (ref 98–111)
Creatinine, Ser: 0.72 mg/dL (ref 0.44–1.00)
GFR calc Af Amer: >60 mL/min (ref >60)
GFR calc non Af Amer: >60 mL/min (ref >60)
GLUCOSE: 114 mg/dL — AB (ref 70–99)
Potassium: 3.5 mmol/L (ref 3.5–5.1)
Sodium: 134 mmol/L — ABNORMAL LOW (ref 135–145)
Total Bilirubin: 0.5 mg/dL (ref 0.3–1.2)
Total Protein: 6.1 g/dL — ABNORMAL LOW (ref 6.5–8.1)

## 2018-10-15 LAB — CBC
HCT: 33.1 % — ABNORMAL LOW (ref 36.0–46.0)
HCT: 34.7 % — ABNORMAL LOW (ref 36.0–46.0)
Hemoglobin: 10.7 g/dL — ABNORMAL LOW (ref 12.0–15.0)
Hemoglobin: 11 g/dL — ABNORMAL LOW (ref 12.0–15.0)
MCH: 26.6 pg (ref 26.0–34.0)
MCH: 27.4 pg (ref 26.0–34.0)
MCHC: 31.7 g/dL (ref 30.0–36.0)
MCHC: 32.3 g/dL (ref 30.0–36.0)
MCV: 84 fL (ref 80.0–100.0)
MCV: 84.7 fL (ref 80.0–100.0)
Platelets: 167 10*3/uL (ref 150–400)
Platelets: 171 10*3/uL (ref 150–400)
RBC: 3.91 MIL/uL (ref 3.87–5.11)
RBC: 4.13 MIL/uL (ref 3.87–5.11)
RDW: 14.1 % (ref 11.5–15.5)
RDW: 14.3 % (ref 11.5–15.5)
WBC: 10.6 10*3/uL — ABNORMAL HIGH (ref 4.0–10.5)
WBC: 8.5 10*3/uL (ref 4.0–10.5)
nRBC: 0 % (ref 0.0–0.2)
nRBC: 0 % (ref 0.0–0.2)

## 2018-10-15 MED ORDER — ACETAMINOPHEN 325 MG PO TABS: 650.0000 mg | ORAL_TABLET | ORAL | Status: DC | PRN

## 2018-10-15 MED ORDER — PHENYLEPHRINE 40 MCG/ML (10ML) SYRINGE FOR IV PUSH (FOR BLOOD PRESSURE SUPPORT): 80.0000 ug | PREFILLED_SYRINGE | INTRAVENOUS | Status: DC | PRN

## 2018-10-15 MED ORDER — WITCH HAZEL-GLYCERIN EX PADS: 1.0000 "application " | MEDICATED_PAD | CUTANEOUS | Status: DC | PRN

## 2018-10-15 MED ORDER — PRENATAL MULTIVITAMIN CH
1.0000 | ORAL_TABLET | Freq: Every day | ORAL | Status: DC
  Administered 2018-10-15 – 2018-10-17 (×3): 1 via ORAL
  Filled 2018-10-15 (×3): qty 1

## 2018-10-15 MED ORDER — EPHEDRINE 5 MG/ML INJ: 10.0000 mg | INTRAVENOUS | Status: DC | PRN

## 2018-10-15 MED ORDER — ONDANSETRON HCL 4 MG/2ML IJ SOLN: 4.0000 mg | INTRAMUSCULAR | Status: DC | PRN

## 2018-10-15 MED ORDER — ONDANSETRON HCL 4 MG PO TABS: 4.0000 mg | ORAL_TABLET | ORAL | Status: DC | PRN

## 2018-10-15 MED ORDER — ENALAPRIL MALEATE 5 MG PO TABS
10.0000 mg | ORAL_TABLET | Freq: Every day | ORAL | Status: DC
  Administered 2018-10-15 – 2018-10-17 (×3): 10 mg via ORAL
  Filled 2018-10-15 (×3): qty 2

## 2018-10-15 MED ORDER — MAGNESIUM SULFATE 40 G IN LACTATED RINGERS - SIMPLE: 2.0000 g/h | INTRAVENOUS | Status: DC

## 2018-10-15 MED ORDER — DIBUCAINE 1 % RE OINT: 1.0000 "application " | TOPICAL_OINTMENT | RECTAL | Status: DC | PRN

## 2018-10-15 MED ORDER — ZOLPIDEM TARTRATE 5 MG PO TABS: 5.0000 mg | ORAL_TABLET | Freq: Every evening | ORAL | Status: DC | PRN

## 2018-10-15 MED ORDER — FERROUS SULFATE 325 (65 FE) MG PO TABS
325.0000 mg | ORAL_TABLET | Freq: Two times a day (BID) | ORAL | Status: DC
  Administered 2018-10-15 – 2018-10-17 (×5): 325 mg via ORAL
  Filled 2018-10-15 (×5): qty 1

## 2018-10-15 MED ORDER — LACTATED RINGERS IV SOLN: 500.0000 mL | Freq: Once | INTRAVENOUS | Status: DC

## 2018-10-15 MED ORDER — SENNOSIDES-DOCUSATE SODIUM 8.6-50 MG PO TABS
2.0000 | ORAL_TABLET | ORAL | Status: DC
  Administered 2018-10-15 – 2018-10-16 (×2): 2 via ORAL
  Filled 2018-10-15 (×2): qty 2

## 2018-10-15 MED ORDER — LABETALOL HCL 5 MG/ML IV SOLN
20.0000 mg | INTRAVENOUS | Status: DC | PRN
  Administered 2018-10-15: 20 mg via INTRAVENOUS
  Filled 2018-10-15: qty 4

## 2018-10-15 MED ORDER — FENTANYL-BUPIVACAINE-NACL 0.5-0.125-0.9 MG/250ML-% EP SOLN
12.0000 mL/h | EPIDURAL | Status: DC | PRN
  Filled 2018-10-15: qty 250

## 2018-10-15 MED ORDER — MAGNESIUM SULFATE BOLUS VIA INFUSION
4.0000 g | Freq: Once | INTRAVENOUS | Status: DC
  Filled 2018-10-15: qty 500

## 2018-10-15 MED ORDER — TETANUS-DIPHTH-ACELL PERTUSSIS 5-2.5-18.5 LF-MCG/0.5 IM SUSP: 0.5000 mL | Freq: Once | INTRAMUSCULAR | Status: DC

## 2018-10-15 MED ORDER — DIPHENHYDRAMINE HCL 50 MG/ML IJ SOLN: 12.5000 mg | INTRAMUSCULAR | Status: DC | PRN

## 2018-10-15 MED ORDER — LABETALOL HCL 5 MG/ML IV SOLN: 40.0000 mg | INTRAVENOUS | Status: DC | PRN

## 2018-10-15 MED ORDER — LABETALOL HCL 5 MG/ML IV SOLN: 80.0000 mg | INTRAVENOUS | Status: DC | PRN

## 2018-10-15 MED ORDER — DIPHENHYDRAMINE HCL 25 MG PO CAPS: 25.0000 mg | ORAL_CAPSULE | Freq: Four times a day (QID) | ORAL | Status: DC | PRN

## 2018-10-15 MED ORDER — HYDRALAZINE HCL 20 MG/ML IJ SOLN: 10.0000 mg | INTRAMUSCULAR | Status: DC | PRN

## 2018-10-15 MED ORDER — COCONUT OIL OIL: 1.0000 "application " | TOPICAL_OIL | Status: DC | PRN

## 2018-10-15 MED ORDER — IBUPROFEN 600 MG PO TABS
600.0000 mg | ORAL_TABLET | Freq: Four times a day (QID) | ORAL | Status: DC
  Administered 2018-10-15 – 2018-10-17 (×10): 600 mg via ORAL
  Filled 2018-10-15 (×10): qty 1

## 2018-10-15 MED ORDER — SIMETHICONE 80 MG PO CHEW: 80.0000 mg | CHEWABLE_TABLET | ORAL | Status: DC | PRN

## 2018-10-15 MED ORDER — BENZOCAINE-MENTHOL 20-0.5 % EX AERO: 1.0000 "application " | INHALATION_SPRAY | CUTANEOUS | Status: DC | PRN

## 2018-10-15 NOTE — Discharge Summary (Addendum)
Obstetrics Discharge Summary OB/GYN Faculty Practice   Patient Name: Niria Bily DOB: 09/04/1985 MRN: [id number]  Date of admission: 10/14/2018 Delivering MD: Lennox Pippins B   Date of discharge: 10/17/2018  Admitting diagnosis: pregnancy Intrauterine pregnancy: [redacted]w[redacted]d     Secondary diagnosis:   Active Problems:   Gestational hypertension   Discharge diagnosis: Term Pregnancy Delivered                                            Postpartum procedures: None  Complications:   Outpatient Follow-Up: [ ]  BP check [ ]  decision on continuation of enalapril (started postpartum for moderate range BP)  Hospital course: Jevon Safer is a 33 y.o. [redacted]w[redacted]d who was admitted for IOL for gHTN. Her pregnancy was complicated by same. Her labor course was notable for induction with cytotec and FB, SROM and then quick progression to complete and deliver. Delivery was uncomplicated. Immediately prior to delivery had severe range pressure requiring treatment with IV labetalol. Magnesium had not been started prior to delivery because so precipitous. Monitored blood pressures on labor and delivery for about an hour and no more severe range BP, so Mg++ was not started . Please see delivery/op note for additional details. Her postpartum course was uncomplicated. She was breastfeeding without difficulty. By day of discharge, she was passing flatus, urinating, eating and drinking without difficulty. Her pain was well-controlled, and she was discharged home with Enalapril 10mg  for her blood pressure. She will follow-up in clinic in 1 week for blood pressure check and 4-6 weeks for postpartum visit. Please continue to discuss contraception options with patient.  Physical exam  Vitals:   10/16/18 1051 10/16/18 1455 10/16/18 2119 10/17/18 0515  BP: 139/78 129/75 133/64 136/66  Pulse:  61 62 (!) 55  Resp:  18 18 18   Temp:  98.3 F (36.8 C) 98.6 F (37 C) 98 F (36.7 C)  TempSrc:  Oral Oral Oral  SpO2:  100%  100% 100%  Weight:      Height:       General: Well appearing in no acute distress, lying comfortably in bed with baby Lochia: appropriate Uterine Fundus: firm Incision: N/A DVT Evaluation: No evidence of DVT seen on physical exam. No cords or calf tenderness. No significant calf/ankle edema. Labs: Lab Results  Component Value Date   WBC 10.6 (H) 10/15/2018   HGB 10.7 (L) 10/15/2018   HCT 33.1 (L) 10/15/2018   MCV 84.7 10/15/2018   PLT 171 10/15/2018   CMP Latest Ref Rng & Units 10/15/2018  Glucose 70 - 99 mg/dL 897(O)  BUN 6 - 20 mg/dL 6  Creatinine 4.78 - 4.12 mg/dL 8.20  Sodium 813 - 887 mmol/L 134(L)  Potassium 3.5 - 5.1 mmol/L 3.5  Chloride 98 - 111 mmol/L 107  CO2 22 - 32 mmol/L 19(L)  Calcium 8.9 - 10.3 mg/dL 1.9(L)  Total Protein 6.5 - 8.1 g/dL 6.1(L)  Total Bilirubin 0.3 - 1.2 mg/dL 0.5  Alkaline Phos 38 - 126 U/L 93  AST 15 - 41 U/L 18  ALT 0 - 44 U/L 15    Discharge instructions: Per After Visit Summary and "Baby and Me Booklet"  After visit meds:  Allergies as of 10/17/2018   No Known Allergies     Medication List    TAKE these medications   enalapril 10 MG tablet Commonly known as:  VASOTEC Take  1 tablet (10 mg total) by mouth daily.   ferrous sulfate 325 (65 FE) MG tablet Take 1 tablet (325 mg total) by mouth 2 (two) times daily with a meal.   ibuprofen 600 MG tablet Commonly known as:  ADVIL,MOTRIN Take 1 tablet (600 mg total) by mouth every 6 (six) hours.   multivitamin-prenatal 27-0.8 MG Tabs tablet Take 1 tablet by mouth daily at 12 noon.   senna-docusate 8.6-50 MG tablet Commonly known as:  Senokot-S Take 2 tablets by mouth daily. Start taking on:  October 18, 2018       Postpartum contraception: Patch Diet: Routine Diet Activity: Advance as tolerated. Pelvic rest for 6 weeks.   Follow-up Appt: Future Appointments  Date Time Provider Department Center  10/22/2018  1:15 PM [facility] NURSE [facility] None  11/11/2018  1:15 PM Hermina Staggers, MD [facility] None   Please schedule this patient for Postpartum visit in: 1 week for a BP check and in 4-6 weeks for a postpartum visit with the following provider: Any provider High risk pregnancy complicated by: gestational hypertension Delivery mode:  SVD Anticipated Birth Control:  OCPs PP Procedures needed: BP check  Schedule Integrated BH visit: no  Newborn Data: Live born female  Birth Weight: 4 lb 12 oz (2155 g) APGAR: ,   Newborn Delivery   Birth date/time:  10/15/2018 01:13:00 Delivery type:  Vaginal, Spontaneous    Baby Feeding: Bottle Disposition:home with mother  Orpah Cobb, DO [facility], PGY1 10/17/2018   I confirm that I have verified the information documented in the resident's note and that I have also personally reperformed the history, physical exam and all medical decision making activities of this service and have verified that all service and findings are accurately documented in this student's note.   Rolm Bookbinder, [place] 10/17/2018 12:54 PM

## 2018-10-15 NOTE — Progress Notes (Deleted)
Labor Progress Note Christine Mullen is a 33 y.o. G1P0 at [redacted]w[redacted]d  admitted for IOL for gHTN.  S:  Comfortable and FB is out. that she is having +FM, she denies HA, RUQ pain, Visual Symptoms.   O:  BP (!) 170/64   Pulse (!) 59   Temp 98.8 F (37.1 C) (Oral)   Resp 19   Ht 5\' 6"  (1.676 m)   Wt 118.8 kg   LMP 01/28/2018   BMI 42.29 kg/m  EFM: baseline 130 bpm/ >6 variability/ + accels/ - decels  Toco: every 2-7 mins  SVE: Dilation: 5 Effacement (%): 90 Cervical Position: Posterior Station: -1 Presentation: Vertex Exam by:: Dr.Loye Vento  Pitocin: 2 mu/min Cytotec: s/p X3    A/P: Christine Mullen is a 33 y.o. G1P0 at [redacted]w[redacted]d  admitted for IOL for gHTN. Has now has x2 severe range BP 15 mins apart   1. Labor: Latent  2. FWB: Cat 1  3. Pain: Mild  4. Severe range blood pressures   -Preeclampsia Focused protocol  -Labetalol for BP 20mg  IV  - Mg 4g Bolus then 2g/hr over next 24 hrs IV -CBC CMP  -Epidural on request.   Sandi Raveling, MD PGY 1 [facility]  12:59 AM

## 2018-10-15 NOTE — Progress Notes (Signed)
She has had no severe range pressures since delivery. Decision was made to hold on Magnesium bolus and infusion and also IV labetalol. She is having no Preeclampsia symptoms when she was taken to post partum. PO Enalapril was added to her transfer order to PP.   BP 138/69 (BP Location: Left Arm)   Pulse 63   Temp 98.4 F (36.9 C) (Oral)   Resp 18   Ht 5\' 6"  (1.676 m)   Wt 118.8 kg   LMP 01/28/2018   Breastfeeding Unknown   BMI 42.29 kg/m    Plan:  -Continue to monitor BP on PP  -Enalapril 10mg  PO QD  -BP check on discharge in 1 week

## 2018-10-16 NOTE — Progress Notes (Signed)
POSTPARTUM PROGRESS NOTE  Post Partum Day 1  Subjective:  Christine Mullen is a 33 y.o. G1P1001 s/p SVD at [redacted]w[redacted]d.  She reports she is doing well. No acute events overnight. She denies any problems with ambulating, voiding or po intake. Denies nausea or vomiting.  Pain is well controlled.  Lochia is mild. Denies any headaches, vision changes, or RUQ pain.  Objective: Blood pressure 139/78, pulse 65, temperature 98.6 F (37 C), temperature source Tympanic, resp. rate 18, height 5\' 6"  (1.676 m), weight 118.8 kg, last menstrual period 01/28/2018, SpO2 100 %, unknown if currently breastfeeding.  Physical Exam:  General: alert, cooperative and no distress Chest: no respiratory distress Heart:regular rate, distal pulses intact Abdomen: soft, nontender,  Uterine Fundus: firm, appropriately tender DVT Evaluation: No calf swelling or tenderness Extremities: no LE edema Skin: warm, dry  Recent Labs    10/15/18 0031 10/15/18 0653  HGB 11.0* 10.7*  HCT 34.7* 33.1*    Assessment/Plan: Christine Mullen is a 33 y.o. G1P1001 s/p SVD at [redacted]w[redacted]d   PPD#1 - Doing well  Routine postpartum care HTN: Elevated BP's overnight in 140-150's systolic, AM BP 138/79. Started on Enalapril 10mg  yesterday. Currently asymptomatic. Will continue to monitor. Patient to follow up oupatient for BP check in 1 week. Contraception: OCPs Feeding: Formula Dispo: Plan for discharge tomorrow.   LOS: 2 days   Orpah Cobb, D.O. [facility], PGY1 10/16/2018, 11:23 AM

## 2018-10-17 MED ORDER — SENNOSIDES-DOCUSATE SODIUM 8.6-50 MG PO TABS: 2.0000 | ORAL_TABLET | ORAL | 0 refills | Status: AC

## 2018-10-17 MED ORDER — IBUPROFEN 600 MG PO TABS: 600.0000 mg | ORAL_TABLET | Freq: Four times a day (QID) | ORAL | 0 refills | Status: AC

## 2018-10-17 MED ORDER — ENALAPRIL MALEATE 10 MG PO TABS: 10.0000 mg | ORAL_TABLET | Freq: Every day | ORAL | 0 refills | Status: DC

## 2018-10-17 NOTE — Discharge Instructions (Signed)
Vaginal Delivery, Care After °Refer to this sheet in the next few weeks. These instructions provide you with information about caring for yourself after vaginal delivery. Your health care provider may also give you more specific instructions. Your treatment has been planned according to current medical practices, but problems sometimes occur. Call your health care provider if you have any problems or questions. °What can I expect after the procedure? °After vaginal delivery, it is common to have: °· Some bleeding from your vagina. °· Soreness in your abdomen, your vagina, and the area of skin between your vaginal opening and your anus (perineum). °· Pelvic cramps. °· Fatigue. °Follow these instructions at home: °Medicines °· Take over-the-counter and prescription medicines only as told by your health care provider. °· If you were prescribed an antibiotic medicine, take it as told by your health care provider. Do not stop taking the antibiotic until it is finished. °Driving ° °· Do not drive or operate heavy machinery while taking prescription pain medicine. °· Do not drive for 24 hours if you received a sedative. °Lifestyle °· Do not drink alcohol. This is especially important if you are breastfeeding or taking medicine to relieve pain. °· Do not use tobacco products, including cigarettes, chewing tobacco, or e-cigarettes. If you need help quitting, ask your health care provider. °Eating and drinking °· Drink at least 8 eight-ounce glasses of water every day unless you are told not to by your health care provider. If you choose to breastfeed your baby, you may need to drink more water than this. °· Eat high-fiber foods every day. These foods may help prevent or relieve constipation. High-fiber foods include: °? Whole grain cereals and breads. °? Brown rice. °? Beans. °? Fresh fruits and vegetables. °Activity °· Return to your normal activities as told by your health care provider. Ask your health care provider what  activities are safe for you. °· Rest as much as possible. Try to rest or take a nap when your baby is sleeping. °· Do not lift anything that is heavier than your baby or 10 lb (4.5 kg) until your health care provider says that it is safe. °· Talk with your health care provider about when you can engage in sexual activity. This may depend on your: °? Risk of infection. °? Rate of healing. °? Comfort and desire to engage in sexual activity. °Vaginal Care °· If you have an episiotomy or a vaginal tear, check the area every day for signs of infection. Check for: °? More redness, swelling, or pain. °? More fluid or blood. °? Warmth. °? Pus or a bad smell. °· Do not use tampons or douches until your health care provider says this is safe. °· Watch for any blood clots that may pass from your vagina. These may look like clumps of dark red, brown, or black discharge. °General instructions °· Keep your perineum clean and dry as told by your health care provider. °· Wear loose, comfortable clothing. °· Wipe from front to back when you use the toilet. °· Ask your health care provider if you can shower or take a bath. If you had an episiotomy or a perineal tear during labor and delivery, your health care provider may tell you not to take baths for a certain length of time. °· Wear a bra that supports your breasts and fits you well. °· If possible, have someone help you with household activities and help care for your baby for at least a few days after you   leave the hospital. °· Keep all follow-up visits for you and your baby as told by your health care provider. This is important. °Contact a health care provider if: °· You have: °? Vaginal discharge that has a bad smell. °? Difficulty urinating. °? Pain when urinating. °? A sudden increase or decrease in the frequency of your bowel movements. °? More redness, swelling, or pain around your episiotomy or vaginal tear. °? More fluid or blood coming from your episiotomy or vaginal  tear. °? Pus or a bad smell coming from your episiotomy or vaginal tear. °? A fever. °? A rash. °? Little or no interest in activities you used to enjoy. °? Questions about caring for yourself or your baby. °· Your episiotomy or vaginal tear feels warm to the touch. °· Your episiotomy or vaginal tear is separating or does not appear to be healing. °· Your breasts are painful, hard, or turn red. °· You feel unusually sad or worried. °· You feel nauseous or you vomit. °· You pass large blood clots from your vagina. If you pass a blood clot from your vagina, save it to show to your health care provider. Do not flush blood clots down the toilet without having your health care provider look at them. °· You urinate more than usual. °· You are dizzy or light-headed. °· You have not breastfed at all and you have not had a menstrual period for 12 weeks after delivery. °· You have stopped breastfeeding and you have not had a menstrual period for 12 weeks after you stopped breastfeeding. °Get help right away if: °· You have: °? Pain that does not go away or does not get better with medicine. °? Chest pain. °? Difficulty breathing. °? Blurred vision or spots in your vision. °? Thoughts about hurting yourself or your baby. °· You develop pain in your abdomen or in one of your legs. °· You develop a severe headache. °· You faint. °· You bleed from your vagina so much that you fill two sanitary pads in one hour. °This information is not intended to replace advice given to you by your health care provider. Make sure you discuss any questions you have with your health care provider. °Document Released: 07/19/2000 Document Revised: 01/03/2016 Document Reviewed: 08/06/2015 °Elsevier Interactive Patient Education © 2019 [employer] ° °

## 2018-10-22 ENCOUNTER — Ambulatory Visit: Payer: PRIVATE HEALTH INSURANCE

## 2018-10-27 ENCOUNTER — Other Ambulatory Visit: Payer: Self-pay

## 2018-10-27 ENCOUNTER — Ambulatory Visit: Payer: PRIVATE HEALTH INSURANCE

## 2018-10-27 VITALS — BP 149/90 | HR 88 | Ht 66.0 in | Wt 238.7 lb

## 2018-10-27 DIAGNOSIS — Z0130 Encounter for examination of blood pressure without abnormal findings: Secondary | ICD-10-CM

## 2018-10-27 NOTE — Progress Notes (Signed)
Subjective:  Christine Mullen is a 33 y.o. female here for BP check at 1 week Postpartum.   Hypertension ROS: Not taking medication as directed, she did not take Rx today. Denies HA, blurry visions, chest pains, swollen ankles. She is still smoking.   Objective:  BP (!) 149/90 (BP Location: Left Arm)   Pulse 88   Ht 5\' 6"  (1.676 m)   Wt 238 lb 11.2 oz (108.3 kg)   LMP 01/28/2018   Breastfeeding No   BMI 38.53 kg/m   Appearance alert, well appearing, and in no distress. General exam BP noted to be well controlled today in office.    Assessment:   Blood Pressure asymptomatic.   Plan:  Very strongly urged to quit smoking to reduce cardiovascular risk.  Advised to take medication as directed every morning and return on Friday for BP check per CNM.

## 2018-10-27 NOTE — Progress Notes (Signed)
I have reviewed the patient's chart and consulted with the nurse regarding patient vitals, medication management, and follow up care.

## 2018-10-30 ENCOUNTER — Ambulatory Visit: Payer: PRIVATE HEALTH INSURANCE

## 2018-11-11 ENCOUNTER — Ambulatory Visit: Payer: PRIVATE HEALTH INSURANCE | Admitting: Obstetrics and Gynecology

## 2018-11-16 ENCOUNTER — Ambulatory Visit: Payer: PRIVATE HEALTH INSURANCE | Admitting: Obstetrics and Gynecology

## 2018-11-20 ENCOUNTER — Other Ambulatory Visit: Payer: Self-pay

## 2018-11-20 ENCOUNTER — Ambulatory Visit (INDEPENDENT_AMBULATORY_CARE_PROVIDER_SITE_OTHER): Payer: PRIVATE HEALTH INSURANCE | Admitting: Obstetrics

## 2018-11-20 ENCOUNTER — Encounter: Payer: Self-pay | Admitting: Obstetrics

## 2018-11-20 DIAGNOSIS — Z3202 Encounter for pregnancy test, result negative: Secondary | ICD-10-CM

## 2018-11-20 DIAGNOSIS — Z1389 Encounter for screening for other disorder: Secondary | ICD-10-CM

## 2018-11-20 DIAGNOSIS — Z3009 Encounter for other general counseling and advice on contraception: Secondary | ICD-10-CM

## 2018-11-20 DIAGNOSIS — O165 Unspecified maternal hypertension, complicating the puerperium: Secondary | ICD-10-CM

## 2018-11-20 DIAGNOSIS — Z30013 Encounter for initial prescription of injectable contraceptive: Secondary | ICD-10-CM

## 2018-11-20 LAB — POCT URINE PREGNANCY: Preg Test, Ur: NEGATIVE

## 2018-11-20 MED ORDER — AMLODIPINE BESYLATE 5 MG PO TABS: 5.0000 mg | ORAL_TABLET | Freq: Every day | ORAL | 2 refills | Status: AC

## 2018-11-20 MED ORDER — MEDROXYPROGESTERONE ACETATE 150 MG/ML IM SUSP
150.0000 mg | Freq: Once | INTRAMUSCULAR | Status: AC
  Administered 2018-11-20: 12:00:00 150 mg via INTRAMUSCULAR

## 2018-11-20 MED ORDER — ENALAPRIL MALEATE 10 MG PO TABS: 10.0000 mg | ORAL_TABLET | Freq: Every day | ORAL | 2 refills | Status: AC

## 2018-11-20 MED ORDER — MEDROXYPROGESTERONE ACETATE 150 MG/ML IM SUSP: 150.0000 mg | INTRAMUSCULAR | 4 refills | Status: AC

## 2018-11-20 NOTE — Progress Notes (Signed)
Depo given L Del without complaints. Next Depo due Jul 3-17, pt agrees.

## 2018-11-20 NOTE — Progress Notes (Signed)
Post Partum Exam  Christine Mullen is a 33 y.o. G39P1001 female who presents for a postpartum visit. She is four weeks postpartum following a  Vaginal delivery:on March 12,2020. I have fully reviewed the prenatal and intrapartum course. The delivery was at 37 gestational weeks.  Anesthesia: natural. Postpartum course has been complicated by hypertension. Baby's course has been uncomplicated. Baby is feeding by bottle. Bleeding not at this time. Bowel function is normal. Bladder function is normal. Patient is not sexually active. Contraception method is nothing at this time, but desire depo-provera.. Postpartum depression screening:negative  The following portions of the patient's history were reviewed and updated as appropriate: allergies, current medications, past family history, past medical history, past social history, past surgical history and problem list. Last pap smear done 04/16/2018 and was normal  Review of Systems A comprehensive review of systems was negative.    Objective:  Last menstrual period 01/28/2018, not currently breastfeeding.   General:  alert and no distress   Breasts:  inspection negative, no nipple discharge or bleeding, no masses or nodularity palpable  Lungs: clear to auscultation bilaterally  Heart:  regular rate and rhythm, S1, S2 normal, no murmur, click, rub or gallop  Abdomen: soft, non-tender; bowel sounds normal; no masses,  no organomegaly   Assessment:   1. Postpartum care following vaginal delivery  2. Hypertension in pregnancy, postpartum condition Rx: - enalapril (VASOTEC) 10 MG tablet; Take 1 tablet (10 mg total) by mouth daily before breakfast.  Dispense: 30 tablet; Refill: 2 - amLODipine (NORVASC) 5 MG tablet; Take 1 tablet (5 mg total) by mouth daily before breakfast.  Dispense: 30 tablet; Refill: 2  3. Encounter for other general counseling and advice on contraception - wants Depo Provera  4. Encounter for initial prescription of injectable  contraceptive Rx: - POCT urine pregnancy - medroxyPROGESTERone (DEPO-PROVERA) injection 150 mg - medroxyPROGESTERone (DEPO-PROVERA) 150 MG/ML injection; Inject 1 mL (150 mg total) into the muscle every 3 (three) months.  Dispense: 1 mL; Refill: 4     Plan:   1. Contraception: Depo-Provera injections 2. Continue PNV'S and Iron 3. Follow up in: 2 weeks or as needed.   Brock Bad MD 11-20-2018

## 2018-12-04 ENCOUNTER — Encounter: Payer: Self-pay | Admitting: Obstetrics

## 2018-12-04 ENCOUNTER — Other Ambulatory Visit: Payer: Self-pay

## 2018-12-04 ENCOUNTER — Ambulatory Visit: Payer: PRIVATE HEALTH INSURANCE | Admitting: Obstetrics

## 2018-12-04 DIAGNOSIS — O165 Unspecified maternal hypertension, complicating the puerperium: Secondary | ICD-10-CM

## 2018-12-04 NOTE — Progress Notes (Addendum)
    [facility] POSTPARTUM VISIT ENCOUNTER NOTE I connected with Christine Mullen on 12-04-2018 at 10:45 AM EDT. After speaking with patient over the phone and getting her ready to talk to the provider, the patient never connected with the provider and multiple attempts to reach the pt via phone- patient did not answer or call back.    I connected with@ on 12/04/18 at 10:45 AM EDT via WebEx at home and verified that I am speaking with the correct person using two identifiers.   I discussed the limitations, risks, security and privacy concerns of performing an evaluation and management service by telephone and the availability of in person appointments. I also discussed with the patient that there may be a patient responsible charge related to this service. The patient expressed understanding and agreed to proceed but never connected to the provider, and could not be reached thereafter by phone.  Appointment Date: 12/04/2018   I provided 5 minutes of face-to-face time during this encounter.   Frutoso Chase, RN [facility] [employer] [employer] [facility] 12-04-2018

## 2019-02-19 ENCOUNTER — Ambulatory Visit: Payer: PRIVATE HEALTH INSURANCE

## 2019-03-02 ENCOUNTER — Telehealth: Payer: Self-pay | Admitting: Obstetrics

## 2020-06-19 IMAGING — US US MFM UA CORD DOPPLER
1 series · 13 of 28 positions shown · non-contrast
Comparison: none

[Series 1: us mfm ua cord doppler · 40 acquisitions, 13 frames shown]
[im 2/40]
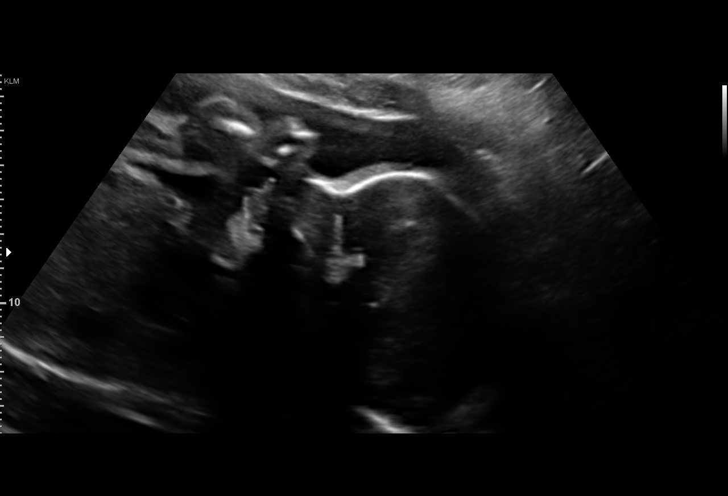
[im 5/40]
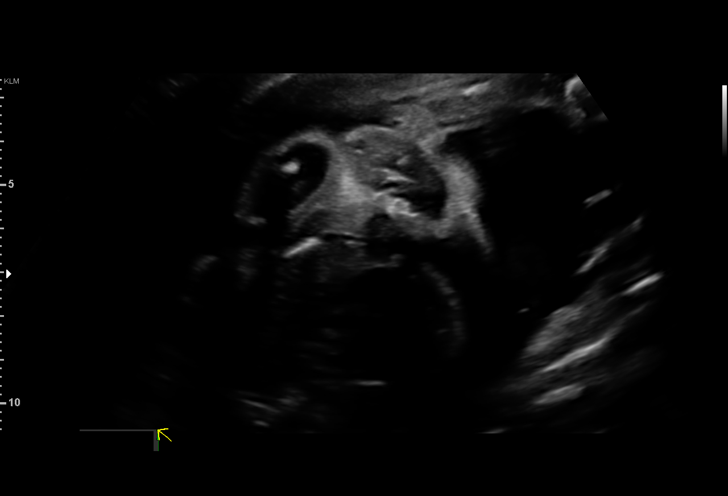
[im 8/40]
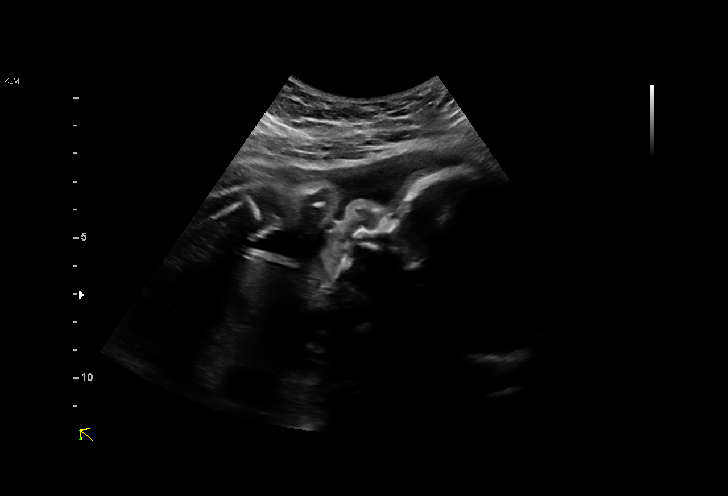
[im 11/40]
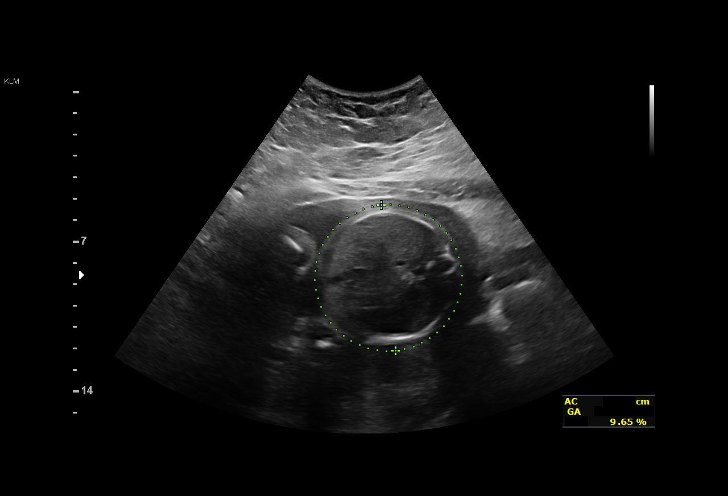
[im 14/40]
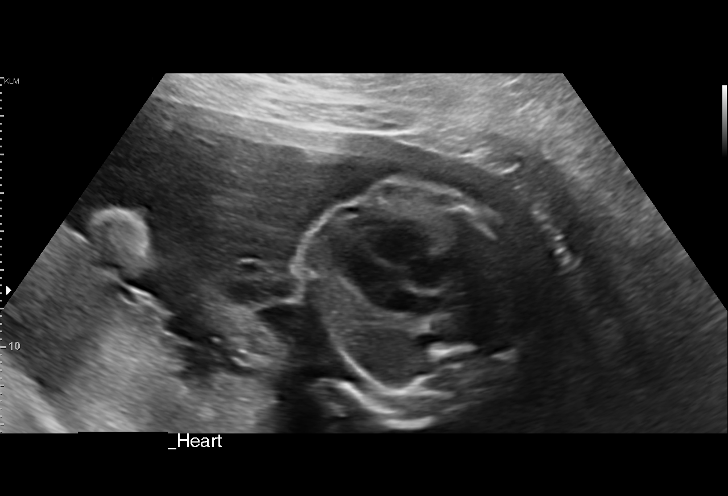
[im 16/40]
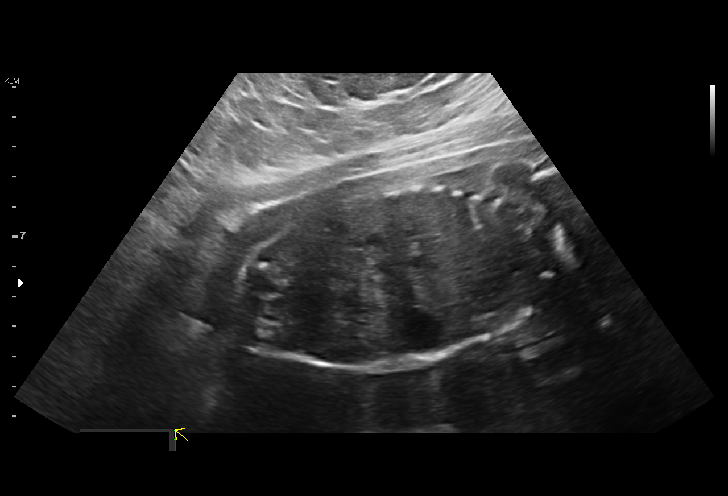
[im 21/40]
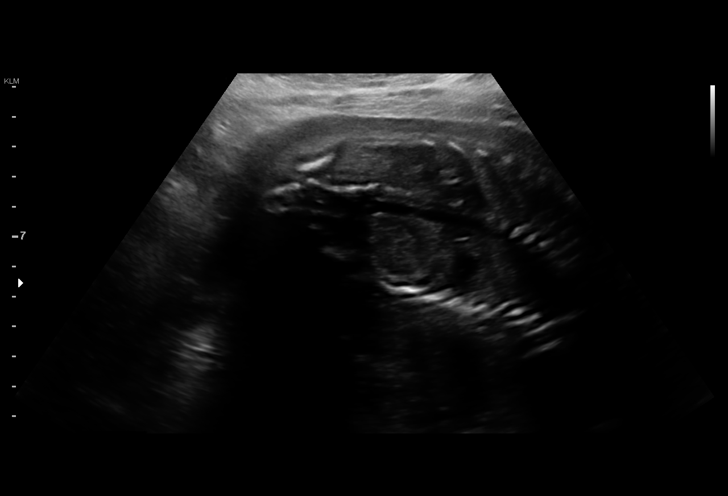
[im 24/40]
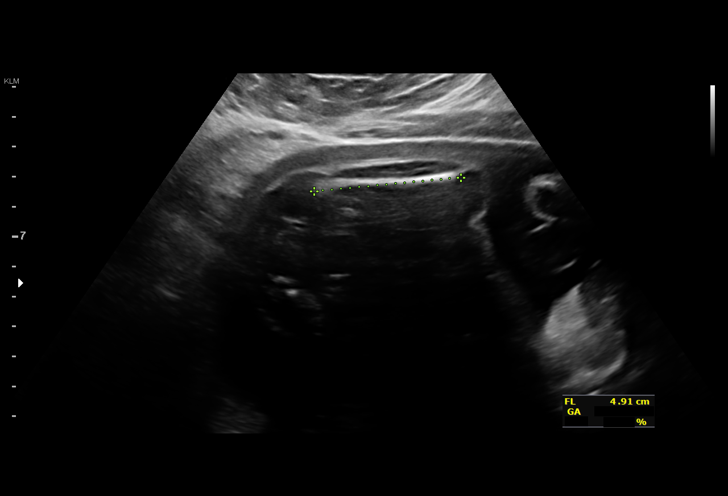
[im 27/40]
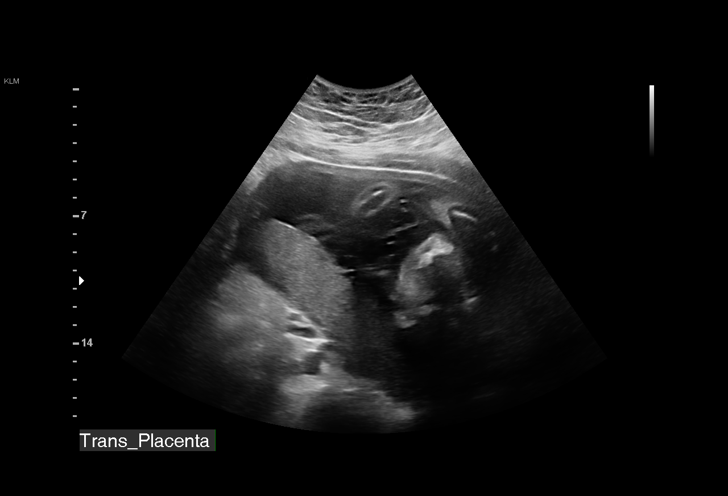
[im 29/40]
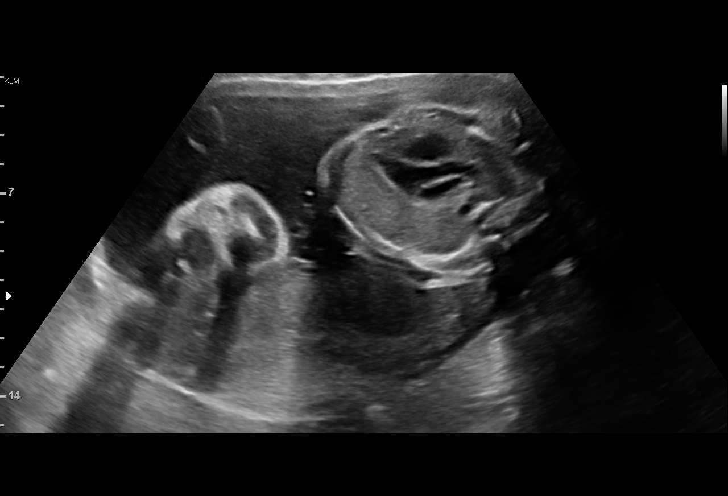
[im 32/40]
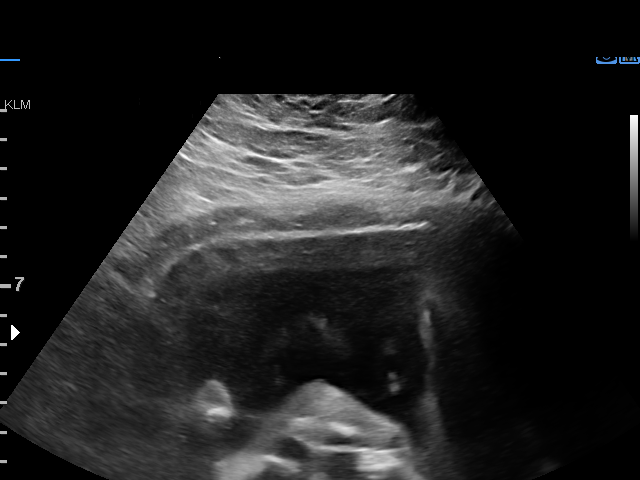
[im 35/40]
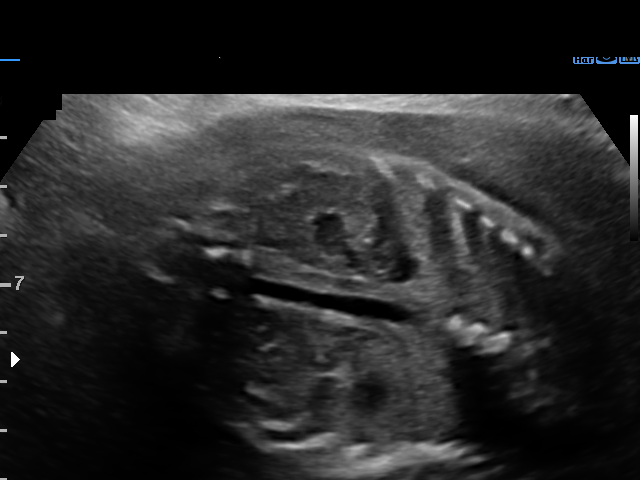
[im 38/40]
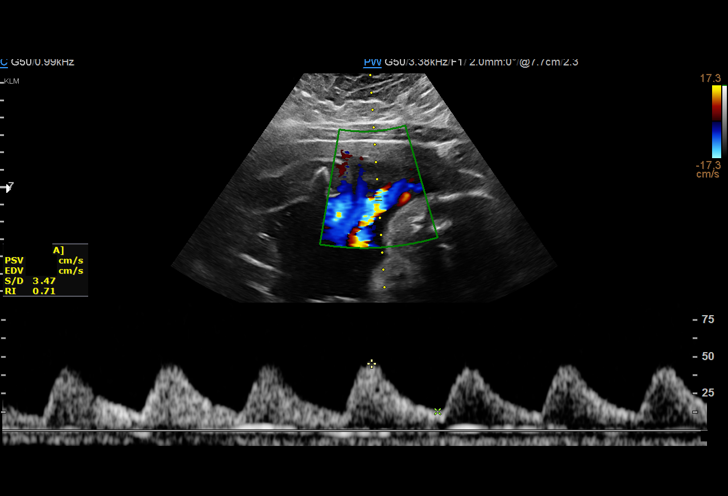

[13 of 28 positions shown; findings below may reference images not displayed]

AIMAR
     STRESS                                            AIMAR
  3  US MFM UA CORD DOPPLER                76820.02    ZEYNEP
                                                       AIMAR
 ----------------------------------------------------------------------

 ----------------------------------------------------------------------
Indications

  Obesity complicating pregnancy, second
  trimester (Pre Pregnancy BMI 34.2)
  27 weeks gestation of pregnancy
  Smoking complicating pregnancy, second
  trimester
  Antenatal follow-up for nonvisualized fetal
  anatomy
 ----------------------------------------------------------------------
Vital Signs

                                                Height:        5'6"
Fetal Evaluation

 Num Of Fetuses:          1
 Fetal Heart              150
 Rate(bpm):
 Cardiac Activity:        Observed
 Presentation:            Cephalic
 Placenta:                Posterior
 P. Cord Insertion:       Previously Visualized

 Amniotic Fluid
 AFI FV:      Within normal limits
Biophysical Evaluation

 Amniotic F.V:   Within normal limits       F. Tone:         Observed
 F. Movement:    Observed                   Score:           [DATE]
 F. Breathing:   Observed
Biometry

 BPD:      63.7  mm     G. Age:  25w 5d          5  %    CI:         73.11  %    70 - 86
                                                         FL/HC:       21.6  %    18.6 -
 HC:      236.8  mm     G. Age:  25w 5d        < 3  %    HC/AC:       1.11       1.05 -
 AC:      213.4  mm     G. Age:  25w 6d          9  %    FL/BPD:      80.2  %    71 - 87
 FL:       51.1  mm     G. Age:  27w 2d         38  %    FL/AC:       23.9  %    20 - 24

 Est. FW:     927   g      2 lb 1 oz     33  %
                    m
OB History

 Gravidity:    1
Gestational Age

 LMP:           27w 2d        Date:  01/28/18                 EDD:    11/04/18
 U/S Today:     26w 1d                                        EDD:    11/12/18
 Best:          27w 2d     Det. By:  LMP  (01/28/18)          EDD:    11/04/18
Anatomy

 Cranium:               Appears normal         Aortic Arch:            Appears normal
 Cavum:                 Previously seen        Ductal Arch:            Appears normal
 Ventricles:            Previously seen        Diaphragm:              Appears normal
 Choroid Plexus:        Previously seen        Stomach:                Appears normal,
                                                                       left sided
 Cerebellum:            Previously seen        Abdomen:                Appears normal
 Posterior Fossa:       Previously seen        Abdominal Wall:         Previously seen
 Nuchal Fold:           Not applicable (>20    Cord Vessels:           Previously seen
                        wks GA)
 Face:                  Appears normal         Kidneys:                Appear normal
                        (orbits and profile)
 Lips:                  Appears normal         Bladder:                Appears normal
 Thoracic:              Appears normal         Spine:                  Previously seen
 Heart:                 Appears normal         Upper Extremities:      Previously seen
                        (4CH, axis, and
                        situs
 RVOT:                  Appears normal         Lower Extremities:      Previously seen
 LVOT:                  Appears normal

 Other:  Fetus appears to be female. Heels previously visualized. Technically
         difficult due to maternal habitus and fetal position.
Doppler - Fetal Vessels

 Umbilical Artery
  S/D     %tile                                            ADFV    RDFV
 3.65        72                                               No      No
Cervix Uterus Adnexa

 Cervix
 Not visualized (advanced GA >49wks)
Impression

 Anatomy completed
 IUGR AC 9th%.EFW 33%
 BPP [DATE]
 UA Dopplers within normal limits
Recommendations

 Follow up growth in 3 weeks.

## 2020-07-17 IMAGING — US US MFM OB FOLLOW-UP
1 series · 14 of 28 positions shown · non-contrast
Comparison: none

[Series 1: us mfm ob follow-up · 31 acquisitions, 14 frames shown]
[im 2/31]
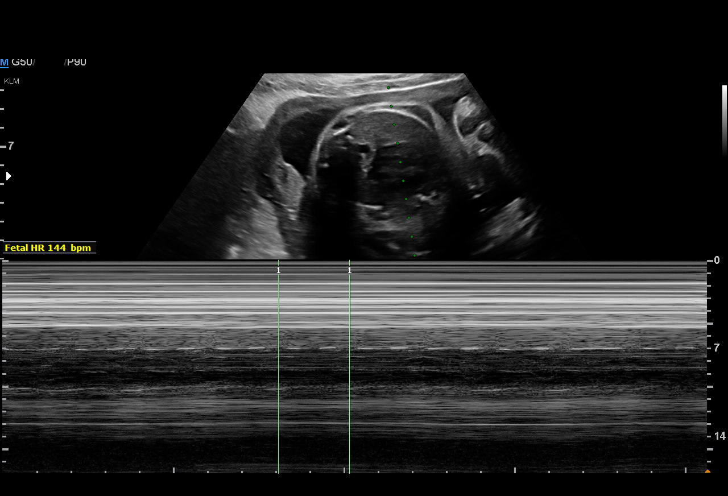
[im 4/31]
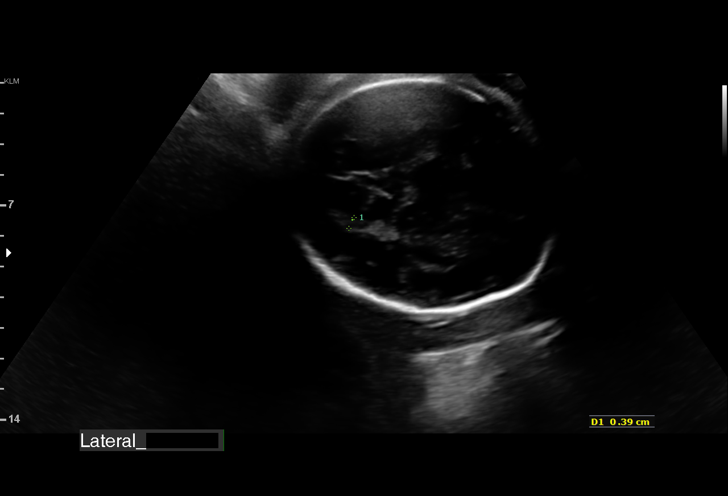
[im 6/31]
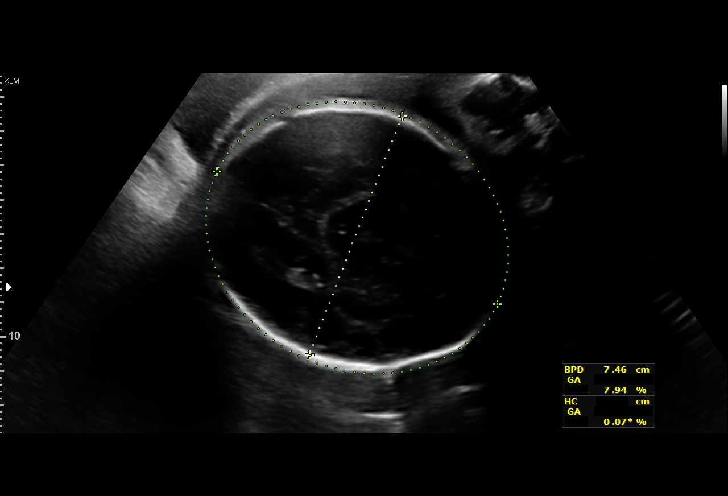
[im 8/31]
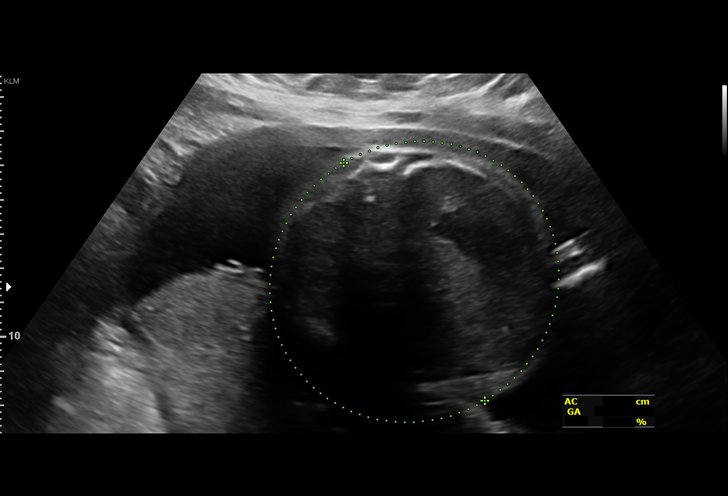
[im 11/31]
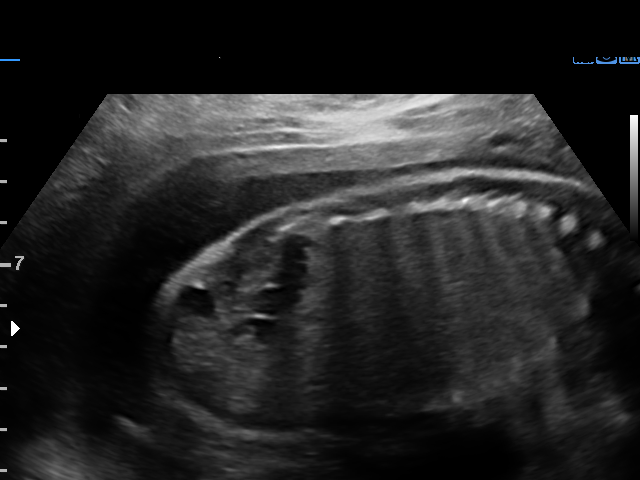
[im 13/31]
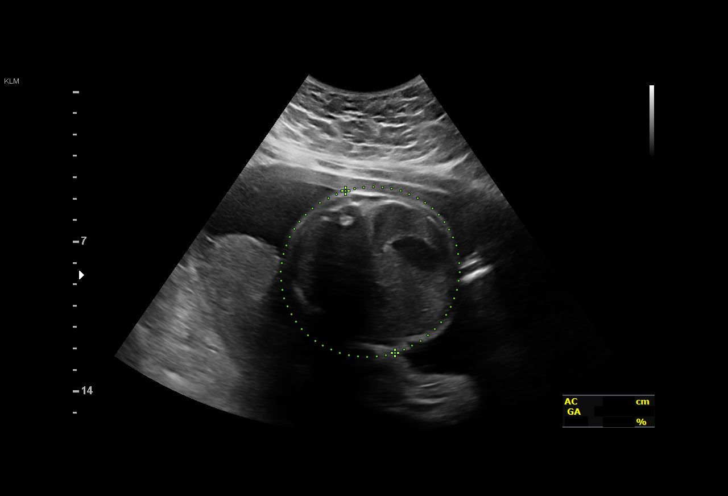
[im 15/31]
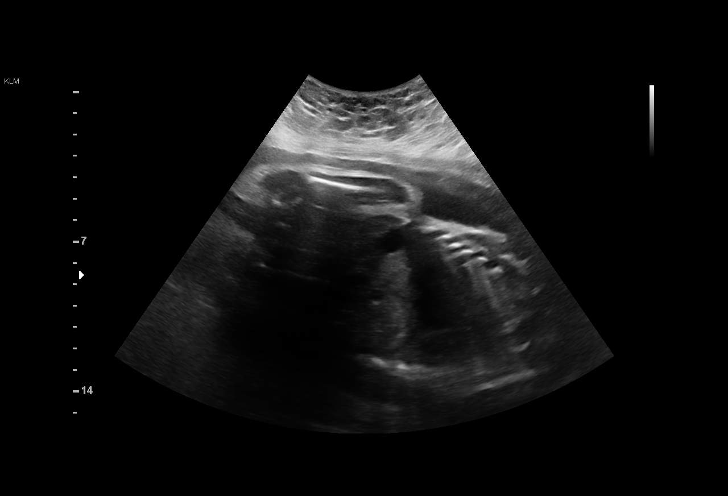
[im 17/31]
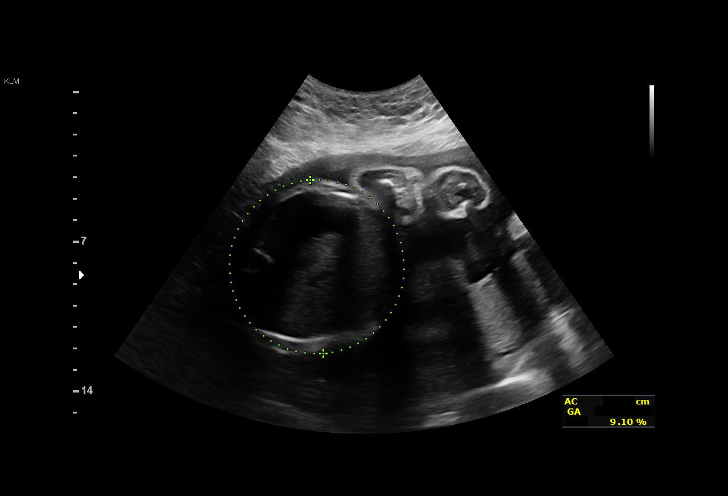
[im 19/31]
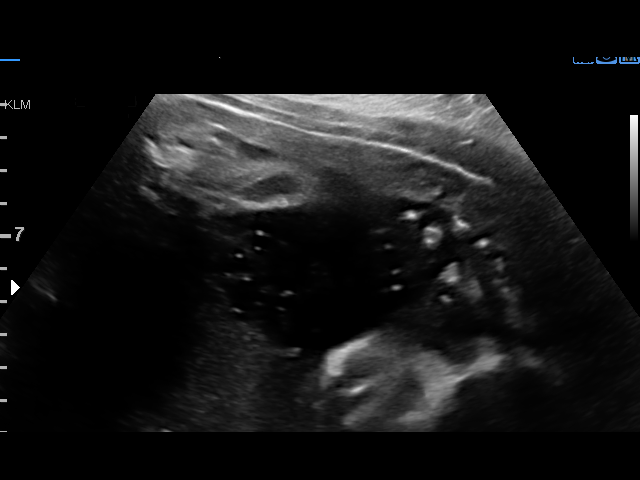
[im 22/31]
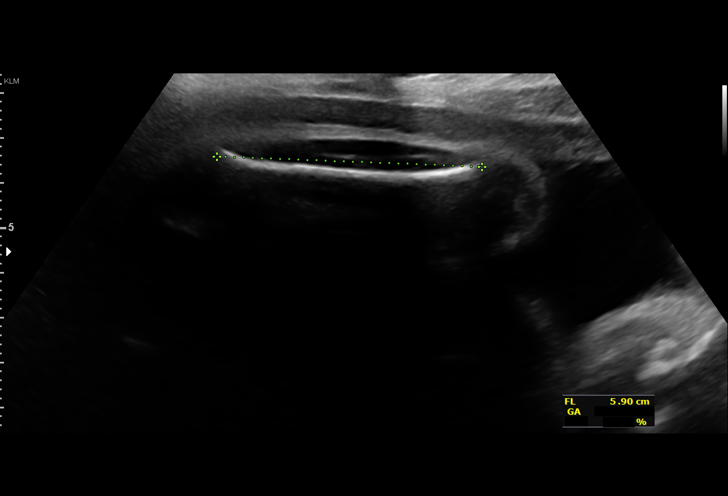
[im 24/31]
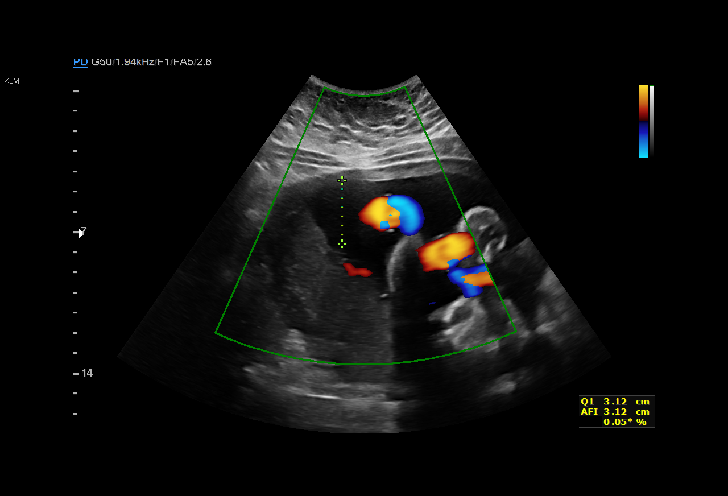
[im 26/31]
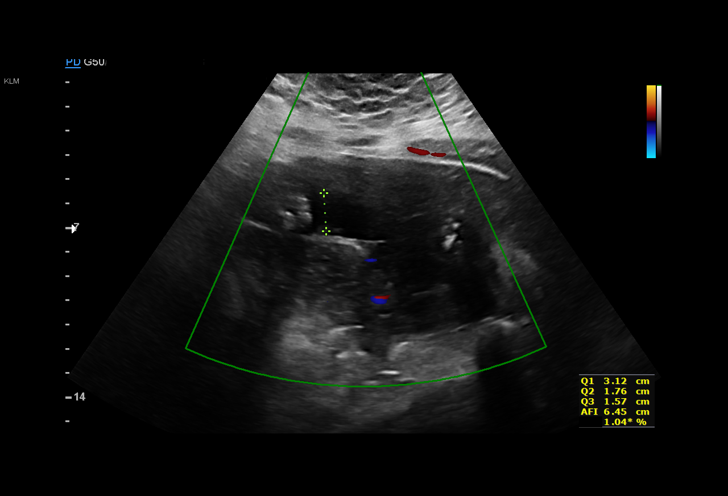
[im 28/31]
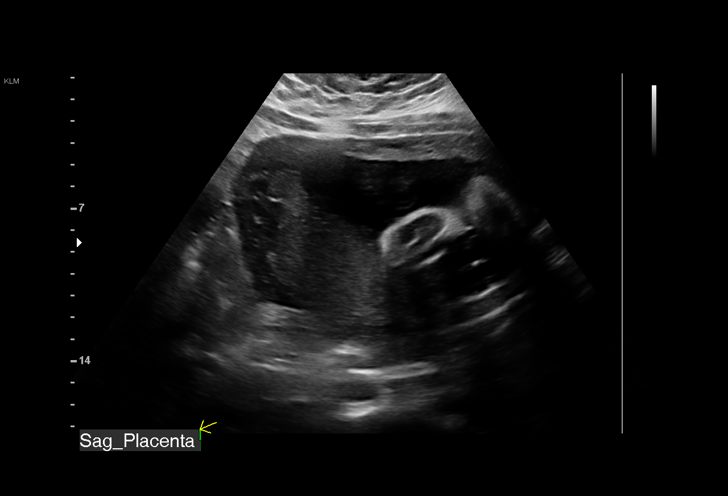
[im 31/31]
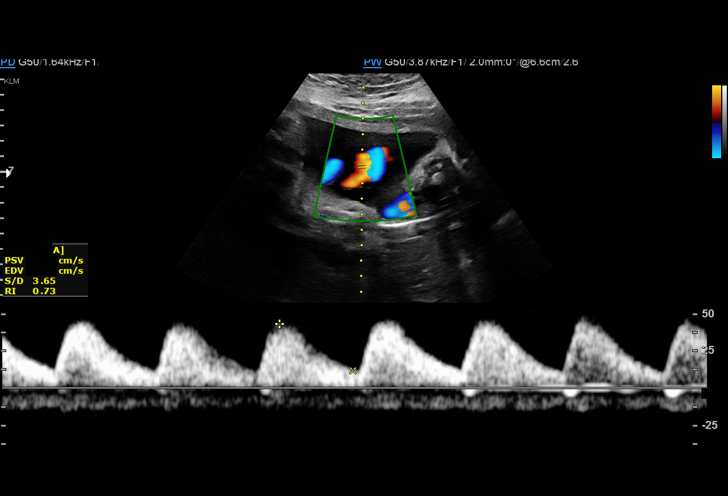

[14 of 28 positions shown; findings below may reference images not displayed]

ROMET
 ----------------------------------------------------------------------

 ----------------------------------------------------------------------
Indications

  Small for gestational age fetus affecting
  management of mother
  31 weeks gestation of pregnancy
  Obesity complicating pregnancy, third
  trimester
  Smoking complicating pregnancy, third
  trimester
 ----------------------------------------------------------------------
Vital Signs

                                                Height:        5'6"
Fetal Evaluation

 Num Of Fetuses:         1
 Fetal Heart Rate(bpm):  126
 Cardiac Activity:       Observed
 Presentation:           Cephalic
 Placenta:               Posterior
 P. Cord Insertion:      Previously Visualized

 Amniotic Fluid
 AFI FV:      Within normal limits

 AFI Sum(cm)     %Tile       Largest Pocket(cm)
 9.8             13

 RUQ(cm)       RLQ(cm)       LUQ(cm)        LLQ(cm)

Biophysical Evaluation

 Amniotic F.V:   Within normal limits       F. Tone:        Observed
 F. Movement:    Observed                   Score:          [DATE]
 F. Breathing:   Observed
Biometry

 BPD:      74.1  mm     G. Age:  29w 5d          6  %    CI:        77.17   %    70 - 86
                                                         FL/HC:      21.6   %    19.3 -
 HC:      267.1  mm     G. Age:  29w 1d        < 3  %    HC/AC:      1.03        0.96 -
 AC:      258.2  mm     G. Age:  30w 0d         14  %    FL/BPD:     77.9   %    71 - 87
 FL:       57.7  mm     G. Age:  30w 1d         13  %    FL/AC:      22.3   %    20 - 24

 Est. FW:    9817  gm      3 lb 4 oz     28  %
OB History

 Gravidity:    1
Gestational Age

 LMP:           31w 2d        Date:  01/28/18                 EDD:   11/04/18
 U/S Today:     29w 5d                                        EDD:   11/15/18
 Best:          31w 2d     Det. By:  LMP  (01/28/18)          EDD:   11/04/18
Anatomy

 Cranium:               Appears normal         Aortic Arch:            Previously seen
 Cavum:                 Previously seen        Ductal Arch:            Previously seen
 Ventricles:            Appears normal         Diaphragm:              Appears normal
 Choroid Plexus:        Previously seen        Stomach:                Appears normal, left
                                                                       sided
 Cerebellum:            Previously seen        Abdomen:                Appears normal
 Posterior Fossa:       Previously seen        Abdominal Wall:         Previously seen
 Nuchal Fold:           Not applicable (>20    Cord Vessels:           Previously seen
                        wks GA)
 Face:                  Orbits and profile     Kidneys:                Appear normal
                        previously seen
 Lips:                  Previously seen        Bladder:                Appears normal
 Thoracic:              Appears normal         Spine:                  Previously seen
 Heart:                 Appears normal         Upper Extremities:      Previously seen
                        (4CH, axis, and situs
 RVOT:                  Previously seen        Lower Extremities:      Previously seen
 LVOT:                  Previously seen

 Other:  Fetus appears to be female. Heels previously visualized. Technically
         difficult due to maternal habitus and fetal position.
Cervix Uterus Adnexa

 Cervix
 Not visualized (advanced GA >39wks)
Impression

 Amniotic fluid is normal and good fetal activity is seen. Fetal
 growth is appropriate for gestational age. HC measurement is
 between -2 and -1 SD (normal). No evidence of fetal growth
 restriction.
 BP at our office 146/68 and 145/78 mm Hg. Patient is
 asymptomatic.
Recommendations

 An appointment was made for her to return in 4 weeks for
 fetal growth assessment.
                 Patlan, Sailor

## 2021-10-10 ENCOUNTER — Encounter: Payer: PRIVATE HEALTH INSURANCE | Admitting: Obstetrics and Gynecology

## 2021-10-10 ENCOUNTER — Other Ambulatory Visit: Payer: Self-pay

## 2021-10-10 ENCOUNTER — Encounter: Payer: Self-pay | Admitting: Obstetrics and Gynecology

## 2021-10-10 DIAGNOSIS — O0990 Supervision of high risk pregnancy, unspecified, unspecified trimester: Secondary | ICD-10-CM | POA: Insufficient documentation

## 2021-10-10 MED ORDER — BLOOD PRESSURE KIT DEVI: 1.0000 | 0 refills | Status: AC
